# Patient Record
Sex: Male | Born: 1990 | Race: Black or African American | Hispanic: No | Marital: Single | Smoking: Current every day smoker
Health system: Southern US, Community
[De-identification: ages and names within clinical notes are randomized; demographics above are authoritative.]

---
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
## 2014-12-29 ENCOUNTER — Emergency Department (INDEPENDENT_AMBULATORY_CARE_PROVIDER_SITE_OTHER)
Admission: EM | Admit: 2014-12-29 | Discharge: 2014-12-29 | Disposition: A | Discharge status: Home / Self Care | Payer: BLUE CROSS/BLUE SHIELD | Source: Home / Self Care

## 2014-12-29 ENCOUNTER — Encounter (HOSPITAL_COMMUNITY): Payer: Self-pay | Admitting: Emergency Medicine

## 2014-12-29 DIAGNOSIS — H109 Unspecified conjunctivitis: Secondary | ICD-10-CM | POA: Diagnosis not present

## 2014-12-29 MED ORDER — POLYMYXIN B-TRIMETHOPRIM 10000-0.1 UNIT/ML-% OP SOLN: 2.0000 [drp] | OPHTHALMIC | Status: DC

## 2014-12-29 NOTE — Discharge Instructions (Signed)

## 2014-12-29 NOTE — ED Notes (Signed)
Left eye with symptoms that started today: eye drainage, watery, red

## 2014-12-30 NOTE — ED Provider Notes (Signed)
CSN: [id number]     Arrival date & time 12/29/14  1908 History   None    Chief Complaint  Patient presents with  . Conjunctivitis   (Consider location/radiation/quality/duration/timing/severity/associated sxs/prior Treatment) HPI History obtained from patient:   LOCATION:left eye SEVERITY:no pain just irritation DURATION:1 day CONTEXT:awoke with glue eye this morning, getting worse over the course of the day, sick kids at home QUALITY:itchy MODIFYING FACTORS:rinsed eye with warm water ASSOCIATED SYMPTOMS:runny nose TIMING:now constant  History reviewed. No pertinent past medical history. History reviewed. No pertinent past surgical history. No family history on file. Social History  Substance Use Topics  . Smoking status: Current Every Day Smoker  . Smokeless tobacco: None  . Alcohol Use: Yes    Review of Systems ROS +'ve "pinkeye" left  Denies: HEADACHE, NAUSEA, ABDOMINAL PAIN, CHEST PAIN, CONGESTION, DYSURIA, SHORTNESS OF BREATH  Allergies  Review of patient's allergies indicates no known allergies.  Home Medications   Prior to Admission medications   Medication Sig Start Date End Date Taking? Authorizing Provider  trimethoprim-polymyxin b (POLYTRIM) ophthalmic solution Place 2 drops into the left eye every 4 (four) hours. 12/29/14   Tharon AquasFrank C Aysha Livecchi, PA   Meds Ordered and Administered this Visit  Medications - No data to display  BP 132/82 mmHg  Pulse 57  Temp(Src) 99 F (37.2 C) (Oral)  Resp 16  SpO2 98% No data found.   Physical Exam  Constitutional: He appears well-developed and well-nourished.  HENT:  Head: Normocephalic and atraumatic.  Eyes: Pupils are equal, round, and reactive to light. Lids are everted and swept, no foreign bodies found. Left eye exhibits discharge. Left eye exhibits no hordeolum. No foreign body present in the left eye. Left conjunctiva is injected. Left conjunctiva has no hemorrhage. Left eye exhibits no nystagmus.  Nursing  note and vitals reviewed.   ED Course  Procedures (including critical care time)  Labs Review Labs Reviewed - No data to display  Imaging Review No results found.   Visual Acuity Review  Right Eye Distance:   Left Eye Distance:   Bilateral Distance:    Right Eye Near:   Left Eye Near:    Bilateral Near:         MDM   1. Conjunctivitis of left eye    NO visual disturbance, no sensation of FB. Rx for polytrim provided.  Advised to follow up if not steadily getting better. Return to work note provided.     Tharon AquasFrank C Millan Legan, PA 12/30/14 [phone]

## 2015-06-11 ENCOUNTER — Encounter (HOSPITAL_COMMUNITY): Payer: Self-pay

## 2015-06-11 ENCOUNTER — Ambulatory Visit (HOSPITAL_COMMUNITY)
Admission: EM | Admit: 2015-06-11 | Discharge: 2015-06-11 | Disposition: A | Discharge status: Home / Self Care | Payer: BLUE CROSS/BLUE SHIELD | Attending: Family Medicine | Admitting: Family Medicine

## 2015-06-11 DIAGNOSIS — F17210 Nicotine dependence, cigarettes, uncomplicated: Secondary | ICD-10-CM | POA: Insufficient documentation

## 2015-06-11 DIAGNOSIS — A64 Unspecified sexually transmitted disease: Secondary | ICD-10-CM

## 2015-06-11 DIAGNOSIS — Z114 Encounter for screening for human immunodeficiency virus [HIV]: Secondary | ICD-10-CM | POA: Insufficient documentation

## 2015-06-11 DIAGNOSIS — Z202 Contact with and (suspected) exposure to infections with a predominantly sexual mode of transmission: Secondary | ICD-10-CM | POA: Insufficient documentation

## 2015-06-11 LAB — POCT URINALYSIS DIP (DEVICE)
Bilirubin Urine: NEGATIVE
Glucose, UA: NEGATIVE mg/dL
KETONES UR: NEGATIVE mg/dL
Nitrite: NEGATIVE
PH: 6 (ref 5.0–8.0)
PROTEIN: NEGATIVE mg/dL
SPECIFIC GRAVITY, URINE: 1.02 (ref 1.005–1.030)
Urobilinogen, UA: 0.2 mg/dL (ref 0.0–1.0)

## 2015-06-11 MED ORDER — CEFTRIAXONE SODIUM 250 MG IJ SOLR
INTRAMUSCULAR | Status: AC
  Filled 2015-06-11: qty 250

## 2015-06-11 MED ORDER — LIDOCAINE HCL (PF) 1 % IJ SOLN
INTRAMUSCULAR | Status: AC
Start: 2015-06-11 — End: 2015-06-11
  Filled 2015-06-11: qty 5

## 2015-06-11 MED ORDER — CEFTRIAXONE SODIUM 250 MG IJ SOLR
250.0000 mg | Freq: Once | INTRAMUSCULAR | Status: AC
  Administered 2015-06-11: 250 mg via INTRAMUSCULAR

## 2015-06-11 MED ORDER — AZITHROMYCIN 250 MG PO TABS
1000.0000 mg | ORAL_TABLET | Freq: Once | ORAL | Status: AC
  Administered 2015-06-11: 1000 mg via ORAL

## 2015-06-11 MED ORDER — AZITHROMYCIN 250 MG PO TABS
ORAL_TABLET | ORAL | Status: AC
  Filled 2015-06-11: qty 4

## 2015-06-11 NOTE — Discharge Instructions (Signed)
Safe Sex  Safe sex is about reducing the risk of giving or getting a sexually transmitted disease (STD). STDs are spread through sexual contact involving the genitals, mouth, or rectum. Some STDs can be cured and others cannot. Safe sex can also prevent unintended pregnancies.   WHAT ARE SOME SAFE SEX PRACTICES?  · Limit your sexual activity to only one partner who is having sex with only you.  · Talk to your partner about his or her past partners, past STDs, and drug use.  · Use a condom every time you have sexual intercourse. This includes vaginal, oral, and anal sexual activity. Both females and males should wear condoms during oral sex. Only use latex or polyurethane condoms and water-based lubricants. Using petroleum-based lubricants or oils to lubricate a condom will weaken the condom and increase the chance that it will break. The condom should be in place from the beginning to the end of sexual activity. Wearing a condom reduces, but does not completely eliminate, your risk of getting or giving an STD. STDs can be spread by contact with infected body fluids and skin.  · Get vaccinated for hepatitis B and HPV.  · Avoid alcohol and recreational drugs, which can affect your judgment. You may forget to use a condom or participate in high-risk sex.  · For females, avoid douching after sexual intercourse. Douching can spread an infection farther into the reproductive tract.  · Check your body for signs of sores, blisters, rashes, or unusual discharge. See your health care provider if you notice any of these signs.  · Avoid sexual contact if you have symptoms of an infection or are being treated for an STD. If you or your partner has herpes, avoid sexual contact when blisters are present. Use condoms at all other times.  · If you are at risk of being infected with HIV, it is recommended that you take a prescription medicine daily to prevent HIV infection. This is called pre-exposure prophylaxis (PrEP). You are  considered at risk if:    You are a man who has sex with other men (MSM).    You are a heterosexual man or woman who is sexually active with more than one partner.    You take drugs by injection.    You are sexually active with a partner who has HIV.  · Talk with your health care provider about whether you are at high risk of being infected with HIV. If you choose to begin PrEP, you should first be tested for HIV. You should then be tested every 3 months for as long as you are taking PrEP.  · See your health care provider for regular screenings, exams, and tests for other STDs. Before having sex with a new partner, each of you should be screened for STDs and should talk about the results with each other.  WHAT ARE THE BENEFITS OF SAFE SEX?   · There is less chance of getting or giving an STD.  · You can prevent unwanted or unintended pregnancies.  · By discussing safe sex concerns with your partner, you may increase feelings of intimacy, comfort, trust, and honesty between the two of you.     This information is not intended to replace advice given to you by your health care provider. Make sure you discuss any questions you have with your health care provider.     Document Released: 03/17/2004 Document Revised: 02/28/2014 Document Reviewed: 08/01/2011  Elsevier Interactive Patient Education ©2016 [employer]

## 2015-06-11 NOTE — ED Provider Notes (Signed)
CSN: [id number]     Arrival date & time 06/11/15  1317 History   First MD Initiated Contact with Patient 06/11/15 1419     Chief Complaint  Patient presents with  . Exposure to STD   (Consider location/radiation/quality/duration/timing/severity/associated sxs/prior Treatment) HPI History obtained from patient: Location:penis Context/Duration1 week onset of white burning discharge from penis, unprotected intercourse with Male partner Severity:1  Quality: buring Timing:      constant      Home Treatment: none Associated symptoms:  none  History reviewed. No pertinent past medical history. History reviewed. No pertinent past surgical history. No family history on file. Social History  Substance Use Topics  . Smoking status: Current Every Day Smoker -- 1.00 packs/day    Types: Cigarettes  . Smokeless tobacco: Never Used  . Alcohol Use: Yes     Comment: occasional    Review of Systems Penile discharge Allergies  Review of patient's allergies indicates no known allergies.  Home Medications   Prior to Admission medications   Medication Sig Start Date End Date Taking? Authorizing Provider  trimethoprim-polymyxin b (POLYTRIM) ophthalmic solution Place 2 drops into the left eye every 4 (four) hours. 12/29/14   Tharon Aquas, PA   Meds Ordered and Administered this Visit   Medications  cefTRIAXone (ROCEPHIN) injection 250 mg (250 mg Intramuscular Given 06/11/15 1519)  azithromycin (ZITHROMAX) tablet 1,000 mg (1,000 mg Oral Given 06/11/15 1508)    BP 125/77 mmHg  Pulse 60  Temp(Src) 99 F (37.2 C) (Oral)  Resp 16  SpO2 100% No data found.   Physical Exam NURSES NOTES AND VITAL SIGNS REVIEWED. CONSTITUTIONAL: Well developed, well nourished, no acute distress HEENT: normocephalic, atraumatic EYES: Conjunctiva normal NECK:normal ROM, supple, no adenopathy PULMONARY:No respiratory distress, normal effort MUSCULOSKELETAL: Normal ROM of all extremities,  SKIN: warm and  dry without rash PSYCHIATRIC: Mood and affect, behavior are normal GENITALIA: normal external male with white discharge from penis.   ED Course  Procedures (including critical care time)  Labs Review Labs Reviewed  POCT URINALYSIS DIP (DEVICE) - Abnormal; Notable for the following:    Hgb urine dipstick TRACE (*)    Leukocytes, UA LARGE (*)    All other components within normal limits  HIV ANTIBODY (ROUTINE TESTING)  CYTOLOGY, (ORAL, ANAL, URETHRAL) ANCILLARY ONLY    Imaging Review No results found.   Visual Acuity Review  Right Eye Distance:   Left Eye Distance:   Bilateral Distance:    Right Eye Near:   Left Eye Near:    Bilateral Near:      tx with azithromycin and ceftriaxone.  Pt is advised that partner should be advised and treated. There is no diagnosis at this time.  Cultures are pending along with HIV.   MDM   1. Sexually transmitted disease (STD)     Patient is reassured that there are no issues that require transfer to higher level of care at this time or additional tests. Patient is advised to continue home symptomatic treatment. Patient is advised that if there are new or worsening symptoms to attend the emergency department, contact primary care provider, or return to UC. Instructions of care provided discharged home in stable condition.    THIS NOTE WAS GENERATED USING A VOICE RECOGNITION SOFTWARE PROGRAM. ALL REASONABLE EFFORTS  WERE MADE TO PROOFREAD THIS DOCUMENT FOR ACCURACY.  I have verbally reviewed the discharge instructions with the patient. A printed AVS was given to the patient.  All questions were answered prior to  discharge.      Tharon AquasFrank C Asma Boldon, PA 06/11/15 [phone]

## 2015-06-11 NOTE — ED Notes (Signed)
Patient has been exposed to STD, but not sure if he has a UTI. His symptoms have been discharge from penis x1 week, no pain No acute distress

## 2015-06-12 LAB — HIV ANTIBODY (ROUTINE TESTING W REFLEX): HIV SCREEN 4TH GENERATION: NONREACTIVE

## 2015-06-12 LAB — CYTOLOGY, (ORAL, ANAL, URETHRAL) ANCILLARY ONLY
Chlamydia: NEGATIVE
NEISSERIA GONORRHEA: POSITIVE — AB

## 2015-06-16 ENCOUNTER — Telehealth (HOSPITAL_COMMUNITY): Payer: Self-pay | Admitting: Emergency Medicine

## 2015-06-16 NOTE — ED Notes (Signed)
LM on pt's VM [phone] Need to give lab results from recent visit on 4/20 Also let pt know labs can be obtained from MyChart  Per Dr. Dayton ScrapeMurray,  Clinical staff, please let patient and health department know that test for gonorrhea was positive.  This was treated at urgent care visit 06/11/15 with IM rocephin 250mg  and po zithromax 1g.  Tests for chlamydia and HIV were negative.  Recheck for further evaluation if symptoms persist.  Result note copied into patient's MyChart. Ria ClockLaura Murray MD   [facility]

## 2015-06-25 NOTE — ED Notes (Signed)
LM on pt's VM [phone] Need to give lab results from recent visit on 4/20 Also let pt know labs can be obtained from MyChart  Per Dr. Dayton ScrapeMurray,  Clinical staff, please let patient and health department know that test for gonorrhea was positive.  This was treated at urgent care visit 06/11/15 with IM rocephin 250mg  and po zithromax 1g.  Tests for chlamydia and HIV were negative.  Recheck for further evaluation if symptoms persist.  Result note copied into patient's MyChart. Ria ClockLaura Murray MD

## 2015-09-30 ENCOUNTER — Ambulatory Visit (HOSPITAL_COMMUNITY)
Admission: EM | Admit: 2015-09-30 | Discharge: 2015-09-30 | Disposition: A | Discharge status: Home / Self Care | Payer: Self-pay | Attending: Family Medicine | Admitting: Family Medicine

## 2015-09-30 ENCOUNTER — Encounter (HOSPITAL_COMMUNITY): Payer: Self-pay | Admitting: *Deleted

## 2015-09-30 DIAGNOSIS — F17210 Nicotine dependence, cigarettes, uncomplicated: Secondary | ICD-10-CM | POA: Insufficient documentation

## 2015-09-30 DIAGNOSIS — R369 Urethral discharge, unspecified: Secondary | ICD-10-CM | POA: Insufficient documentation

## 2015-09-30 DIAGNOSIS — Z7251 High risk heterosexual behavior: Secondary | ICD-10-CM

## 2015-09-30 DIAGNOSIS — Z202 Contact with and (suspected) exposure to infections with a predominantly sexual mode of transmission: Secondary | ICD-10-CM

## 2015-09-30 MED ORDER — AZITHROMYCIN 250 MG PO TABS
1000.0000 mg | ORAL_TABLET | Freq: Once | ORAL | Status: AC
  Administered 2015-09-30: 1000 mg via ORAL

## 2015-09-30 MED ORDER — CEFTRIAXONE SODIUM 250 MG IJ SOLR
INTRAMUSCULAR | Status: AC
  Filled 2015-09-30: qty 250

## 2015-09-30 MED ORDER — CEFTRIAXONE SODIUM 250 MG IJ SOLR
250.0000 mg | Freq: Once | INTRAMUSCULAR | Status: AC
  Administered 2015-09-30: 250 mg via INTRAMUSCULAR

## 2015-09-30 NOTE — ED Triage Notes (Signed)
Pt reports  Symptoms  Of  Penile  Discharge   X  3  Days    denys  Any sores   Or any  Any other symptoms   Pt is  Sitting upright on the  Exam table  Appearing  In no  Acute distress

## 2015-09-30 NOTE — Discharge Instructions (Signed)
As we discussed, if you have any other sexual partners, they need to be tested and treated as well.  I HIGHLY recommend that you practice safe sex.  USE A CONDOM EVERY TIME YOU HAVE INTERCOURSE.  Gonorrhea is treatable; however, some infections like HIV/ Hepatitis/ Herpes are incurable.  Do not have intercourse for the next 10 days.  You will be contacted if any of your other labs come back positive.

## 2015-09-30 NOTE — ED Provider Notes (Signed)
CSN: [id number]     Arrival date & time 09/30/15  1439 History   First MD Initiated Contact with Patient 09/30/15 1454     Chief Complaint  Patient presents with  . Penile Discharge   (Consider location/radiation/quality/duration/timing/severity/associated sxs/prior Treatment) HPI Patient reports that his sexual partner was diagnosed with gonorrhea about 4 days ago.  Last unprotected sex was about 1 week ago.  Sexually active with one male partner.  Has a PMH of GC about 5-6 months ago with a different partner.  Reports dysuria and penile discharge for about 2-3 days.  No fevers, chills, nausea, vomiting, inguinal LAD, no GU rashes or lesions.  Does not take any medications.  SocHx: everyday smoker marijuana, tobacco.  Occ ETOH use.  NKDA.    History reviewed. No pertinent past medical history. History reviewed. No pertinent surgical history. History reviewed. No pertinent family history. Social History  Substance Use Topics  . Smoking status: Current Every Day Smoker    Packs/day: 1.00    Types: Cigarettes  . Smokeless tobacco: Never Used  . Alcohol use Yes     Comment: occasional    Review of Systems  Constitutional: Negative for chills, fatigue and fever.  Eyes: Negative for discharge.  Gastrointestinal: Negative for abdominal pain, nausea and vomiting.  Genitourinary: Positive for discharge and dysuria. Negative for decreased urine volume, difficulty urinating, genital sores, penile pain, penile swelling, scrotal swelling, testicular pain and urgency.  Musculoskeletal: Negative for arthralgias and joint swelling.  Skin: Negative for rash.    Allergies  Review of patient's allergies indicates no known allergies.  Home Medications   Prior to Admission medications   Medication Sig Start Date End Date Taking? Authorizing Provider  trimethoprim-polymyxin b (POLYTRIM) ophthalmic solution Place 2 drops into the left eye every 4 (four) hours. 12/29/14   Tharon AquasFrank C Patrick, PA    Meds Ordered and Administered this Visit   Medications  cefTRIAXone (ROCEPHIN) injection 250 mg (not administered)  azithromycin (ZITHROMAX) tablet 1,000 mg (not administered)    BP 142/77 (BP Location: Right Arm)   Pulse 78   Temp 98.6 F (37 C) (Oral)   Resp 16   SpO2 98%  No data found.   Physical Exam  Constitutional: He appears well-developed and well-nourished. No distress.  HENT:  Mouth/Throat: Oropharynx is clear and moist. No oropharyngeal exudate.  Eyes: Conjunctivae and EOM are normal.  Cardiovascular: Normal rate and intact distal pulses.   Pulmonary/Chest: Effort normal.  Abdominal: Soft. There is no tenderness.  Genitourinary: Penis normal. No penile tenderness.  Genitourinary Comments: No discharge appreciated from penis.  Small well healed scrotal lesion on right side.  Nontender to palpation.  NO suprapubic TTP  Lymphadenopathy:       Right: No inguinal adenopathy present.       Left: No inguinal adenopathy present.  Skin: He is not diaphoretic.  Nursing note and vitals reviewed.  GU exam performed in the presence of Alinda Moneyony, Charity fundraiserN.  Urgent Care Course   Clinical Course   1501: Urine GC/CT and trichomonas ordered, HIV and RPR testing also ordered  Procedures (including critical care time)  Labs Review Labs Reviewed  HIV ANTIBODY (ROUTINE TESTING)  RPR  URINE CYTOLOGY ANCILLARY ONLY   Imaging Review No results found.   MDM   1. Exposure to gonorrhea   2. High risk sexual behavior    Richard Doreene BurkeM Jacobsen is a 25 y.o. male that presents today for penile discharge and known exposure to gonorrhea.  No  discharge appreciated on exam.  No inguinal LAD appreciated.  Patient is nontoxic on exam.  I suspect dysuria is a result of urethritis secondary to GC infection.  No systemic symptoms at this time.  No suprapubic TTP.  We reviewed safe sex practices and I encouraged condom use with each sexual intercourse.  Recommended that any other sexual partners be  tested and treated.  Urine GC/CT and trich obtained.  Patient also to be tested for HIV and syphilis.  Because patient's partner GC+, will also cover for chlamydial infection.  Azithromycin and Rocephin administered in office.  Return precautions reviewed.    Meds ordered this encounter  Medications  . cefTRIAXone (ROCEPHIN) injection 250 mg  . azithromycin (ZITHROMAX) tablet 1,000 mg   This patient was discussed with Hayden Rasmussen, NP, who agrees with my assessment and plan.   Ashly M. Nadine Counts, DO PGY-3, [facility]       Raliegh Ip, DO 09/30/15 [phone]

## 2015-10-01 LAB — RPR: RPR: NONREACTIVE

## 2015-10-01 LAB — URINE CYTOLOGY ANCILLARY ONLY
Chlamydia: NEGATIVE
Neisseria Gonorrhea: POSITIVE — AB
Trichomonas: NEGATIVE

## 2015-10-01 LAB — HIV ANTIBODY (ROUTINE TESTING W REFLEX): HIV Screen 4th Generation wRfx: NONREACTIVE

## 2015-10-07 ENCOUNTER — Telehealth (HOSPITAL_COMMUNITY): Payer: Self-pay | Admitting: Emergency Medicine

## 2015-10-07 NOTE — Telephone Encounter (Signed)
Per Dr. Dayton ScrapeMurray,  Notes Recorded by Eustace MooreLaura W Murray, MD on 10/03/2015 at 11:02 AM EDT Please let patient and health department know that test for gonorrhea was positive.  This was treated at the urgent care with IM rocephin 250mg  and po zithromax 1g.  Sexual partners need to be notified and tested/treated.   Recheck as needed if symptoms persist.  Note sent to patient's MyChart. Ria ClockLaura Murray MD  LM on pt's VM Need to give lab results from recent visit on 8/9 Also let pt know labs can be obtained from MyChart [facility]

## 2015-10-26 NOTE — Telephone Encounter (Signed)
-----   Message from Eustace MooreLaura W Murray, MD sent at 10/03/2015 11:02 AM EDT ----- Please let patient and health department know that test for gonorrhea was positive.   This was treated at the urgent care with IM rocephin 250mg  and po zithromax 1g.  Sexual partners need to be notified and tested/treated.    Recheck as needed if symptoms persist.   Note sent to patient's MyChart.  Ria ClockLaura Murray MD

## 2015-10-26 NOTE — Telephone Encounter (Signed)
LM on pt's VM [phone]... Mailed letter Need to give lab results from recent visit on 8/9 Also let pt know labs can be obtained from MyChart

## 2017-04-02 ENCOUNTER — Other Ambulatory Visit: Payer: Self-pay

## 2017-04-02 ENCOUNTER — Emergency Department (HOSPITAL_COMMUNITY): Payer: Self-pay

## 2017-04-02 ENCOUNTER — Emergency Department (HOSPITAL_COMMUNITY)
Admission: EM | Admit: 2017-04-02 | Discharge: 2017-04-03 | Disposition: A | Discharge status: Home / Self Care | Payer: Self-pay | Attending: Physician Assistant | Admitting: Physician Assistant

## 2017-04-02 ENCOUNTER — Encounter (HOSPITAL_COMMUNITY): Payer: Self-pay | Admitting: Emergency Medicine

## 2017-04-02 DIAGNOSIS — J111 Influenza due to unidentified influenza virus with other respiratory manifestations: Secondary | ICD-10-CM | POA: Insufficient documentation

## 2017-04-02 DIAGNOSIS — F17210 Nicotine dependence, cigarettes, uncomplicated: Secondary | ICD-10-CM | POA: Insufficient documentation

## 2017-04-02 DIAGNOSIS — R112 Nausea with vomiting, unspecified: Secondary | ICD-10-CM | POA: Insufficient documentation

## 2017-04-02 DIAGNOSIS — R42 Dizziness and giddiness: Secondary | ICD-10-CM | POA: Insufficient documentation

## 2017-04-02 DIAGNOSIS — R51 Headache: Secondary | ICD-10-CM | POA: Insufficient documentation

## 2017-04-02 DIAGNOSIS — R6889 Other general symptoms and signs: Secondary | ICD-10-CM

## 2017-04-02 DIAGNOSIS — R197 Diarrhea, unspecified: Secondary | ICD-10-CM | POA: Insufficient documentation

## 2017-04-02 LAB — BASIC METABOLIC PANEL
ANION GAP: 12 (ref 5–15)
BUN: 8 mg/dL (ref 6–20)
CALCIUM: 8.8 mg/dL — AB (ref 8.9–10.3)
CO2: 23 mmol/L (ref 22–32)
CREATININE: 0.88 mg/dL (ref 0.61–1.24)
Chloride: 104 mmol/L (ref 101–111)
Glucose, Bld: 124 mg/dL — ABNORMAL HIGH (ref 65–99)
Potassium: 3.8 mmol/L (ref 3.5–5.1)
Sodium: 139 mmol/L (ref 135–145)

## 2017-04-02 LAB — CBC WITH DIFFERENTIAL/PLATELET
BASOS ABS: 0 10*3/uL (ref 0.0–0.1)
Basophils Relative: 0 %
EOS ABS: 0 10*3/uL (ref 0.0–0.7)
EOS PCT: 0 %
HCT: 48.9 % (ref 39.0–52.0)
Hemoglobin: 16.6 g/dL (ref 13.0–17.0)
Lymphocytes Relative: 8 %
Lymphs Abs: 0.7 10*3/uL (ref 0.7–4.0)
MCH: 29.6 pg (ref 26.0–34.0)
MCHC: 33.9 g/dL (ref 30.0–36.0)
MCV: 87.3 fL (ref 78.0–100.0)
MONO ABS: 0.5 10*3/uL (ref 0.1–1.0)
Monocytes Relative: 6 %
NEUTROS ABS: 7.7 10*3/uL (ref 1.7–7.7)
Neutrophils Relative %: 86 %
PLATELETS: 195 10*3/uL (ref 150–400)
RBC: 5.6 MIL/uL (ref 4.22–5.81)
RDW: 12.9 % (ref 11.5–15.5)
WBC: 8.9 10*3/uL (ref 4.0–10.5)

## 2017-04-02 MED ORDER — SODIUM CHLORIDE 0.9 % IV BOLUS (SEPSIS)
1000.0000 mL | Freq: Once | INTRAVENOUS | Status: AC
  Administered 2017-04-02: 1000 mL via INTRAVENOUS

## 2017-04-02 MED ORDER — ACETAMINOPHEN 325 MG PO TABS
650.0000 mg | ORAL_TABLET | Freq: Once | ORAL | Status: AC
  Administered 2017-04-02: 650 mg via ORAL
  Filled 2017-04-02: qty 2

## 2017-04-02 MED ORDER — ONDANSETRON 4 MG PO TBDP
4.0000 mg | ORAL_TABLET | Freq: Once | ORAL | Status: AC
  Administered 2017-04-02: 4 mg via ORAL
  Filled 2017-04-02: qty 1

## 2017-04-02 NOTE — ED Notes (Signed)
Taken to xray at this time. 

## 2017-04-02 NOTE — ED Provider Notes (Signed)
[facility] EMERGENCY DEPARTMENT Provider Note   CSN: [id number] Arrival date & time: 04/02/17  2302     History   Chief Complaint Chief Complaint  Patient presents with  . Flu like S/S    HPI Richard Gallegos is a 27 y.o. male.  27 y/o male presenting to the ED c/o productive cough with yellow sputum that began last night. He also reports fevers, body aches, sore throat, and headache. Headache is frontal headache started gradually. Not sudden onset and not worst headache of life. Rates it 3/10. Also reports nausea, vomiting, diarrhea started this afternoon. States he has had 3 episodes of vomiting and 3 episodes of diarrhea. Reports watery diarrhea. Denies blood in vomit or stool. Has intermittent abdominal cramping prior to episodes of diarrhea, but no constant and/or localized abdominal pain. Cramping relieved with diarrhea. Since then has had some lightheadedness upon standing.   He denies difficulty swallowing, neck pain, neck stiffness, rashes, vision changes, weakness/numbness, dizziness, shortness of breath, chest pain, or syncope.   Has taken mucinex with no relief.  History reviewed. No pertinent past medical history.  There are no active problems to display for this patient.   History reviewed. No pertinent surgical history.    Home Medications    Prior to Admission medications   Medication Sig Start Date End Date Taking? Authorizing Provider  guaiFENesin (MUCINEX) 600 MG 12 hr tablet Take 600 mg by mouth 2 (two) times daily as needed for cough.   Yes [provider]  benzonatate (TESSALON) 100 MG capsule Take 1 capsule (100 mg total) by mouth every 8 (eight) hours. 04/03/17   Grayland Daisey S, PA-C  ondansetron (ZOFRAN) 4 MG tablet Take 1 tablet (4 mg total) by mouth every 8 (eight) hours as needed for nausea or vomiting. 04/03/17   Mecca Barga S, PA-C  oseltamivir (TAMIFLU) 75 MG capsule Take 1 capsule (75 mg total) by mouth every  12 (twelve) hours. 04/03/17   Lowell Makara S, PA-C  trimethoprim-polymyxin b (POLYTRIM) ophthalmic solution Place 2 drops into the left eye every 4 (four) hours. Patient not taking: Reported on 04/03/2017 12/29/14   Tharon Aquas, PA    Family History No family history on file.  Social History Social History   Tobacco Use  . Smoking status: Current Every Day Smoker    Packs/day: 0.50    Types: Cigarettes  . Smokeless tobacco: Never Used  Substance Use Topics  . Alcohol use: Yes    Comment: occasional  . Drug use: Yes    Frequency: 4.0 times per week    Types: Marijuana     Allergies   Patient has no known allergies.   Review of Systems Review of Systems  Constitutional: Positive for chills, fatigue and fever.  HENT: Positive for congestion, rhinorrhea and sore throat. Negative for ear pain, postnasal drip, sinus pressure, sinus pain, trouble swallowing and voice change.   Respiratory: Positive for cough. Negative for shortness of breath and wheezing.   Cardiovascular: Negative for chest pain and palpitations.  Gastrointestinal: Positive for abdominal pain (cramping intermittent), diarrhea, nausea and vomiting. Negative for blood in stool and constipation.  Genitourinary: Negative for dysuria, frequency and hematuria.  Musculoskeletal: Negative for back pain, neck pain and neck stiffness.  Skin: Negative for color change and rash.  Neurological: Positive for light-headedness and headaches. Negative for dizziness, weakness and numbness.     Physical Exam Updated Vital Signs BP 125/74   Pulse 72   Temp  99 F (37.2 C) (Oral)   Resp 16   Ht 6\' 1"  (1.854 m)   Wt 117.9 kg (260 lb)   SpO2 98%   BMI 34.30 kg/m   Physical Exam  Constitutional: He is oriented to person, place, and time. He appears well-developed and well-nourished.  HENT:  Head: Normocephalic and atraumatic.  Right Ear: External ear normal.  Left Ear: External ear normal.  Nose: Nose normal.    Mouth/Throat: Oropharynx is clear and moist.  Mild pharyngeal erythema.  No tonsils present.  No exudates noted.  No hoarse voice.  Uvula midline.  Eyes: Conjunctivae and EOM are normal. Pupils are equal, round, and reactive to light.  Neck: Normal range of motion. Neck supple.  No nuchal rigidity, full range of motion of neck without pain  Cardiovascular: Normal rate, regular rhythm, normal heart sounds and intact distal pulses.  No murmur heard. Pulmonary/Chest: Effort normal and breath sounds normal. No stridor. No respiratory distress. He has no wheezes.  Abdominal: Soft. Bowel sounds are normal. He exhibits no distension. There is no tenderness. There is no guarding.  Musculoskeletal: Normal range of motion. He exhibits no edema.  Neurological: He is alert and oriented to person, place, and time. No cranial nerve deficit.  Mental Status:  Alert, thought content appropriate, able to give a coherent history. Speech fluent without evidence of aphasia. Able to follow 2 step commands without difficulty.  Cranial Nerves:  II:  Peripheral visual fields grossly normal, pupils equal, round, reactive to light III,IV, VI: ptosis not present, extra-ocular motions intact bilaterally  V,VII: smile symmetric, facial light touch sensation equal VIII: hearing grossly normal to voice  X: uvula elevates symmetrically  XI: bilateral shoulder shrug symmetric and strong XII: midline tongue extension without fassiculations Motor:  Normal tone. 5/5 strength of BUE and BLE major muscle groups including strong and equal grip strength and dorsiflexion/plantar flexion Sensory: light touch normal in all extremities.  Skin: Skin is warm and dry.  Psychiatric: He has a normal mood and affect.  Nursing note and vitals reviewed.    ED Treatments / Results  Labs (all labs ordered are listed, but only abnormal results are displayed) Labs Reviewed  BASIC METABOLIC PANEL - Abnormal; Notable for the following  components:      Result Value   Glucose, Bld 124 (*)    Calcium 8.8 (*)    All other components within normal limits  CBC WITH DIFFERENTIAL/PLATELET    EKG  EKG Interpretation None       Radiology Dg Chest 2 View  Result Date: 04/02/2017 CLINICAL DATA:  27 y/o M; chills, congestion, body aches, fever, sore throat. EXAM: CHEST  2 VIEW COMPARISON:  None. FINDINGS: The heart size and mediastinal contours are within normal limits. Both lungs are clear. The visualized skeletal structures are unremarkable. IMPRESSION: No active cardiopulmonary disease. Electronically Signed   By: Mitzi HansenLance  Furusawa-Stratton M.D.   On: 04/02/2017 23:41    Procedures Procedures (including critical care time)  Medications Ordered in ED Medications  acetaminophen (TYLENOL) tablet 650 mg (650 mg Oral Given 04/02/17 2349)  sodium chloride 0.9 % bolus 1,000 mL (0 mLs Intravenous Stopped 04/03/17 0101)  ondansetron (ZOFRAN-ODT) disintegrating tablet 4 mg (4 mg Oral Given 04/02/17 2349)     Initial Impression / Assessment and Plan / ED Course  I have reviewed the triage vital signs and the nursing notes.  Pertinent labs & imaging results that were available during my care of the patient were reviewed  by me and considered in my medical decision making (see chart for details).   Rechecked pt. He is sitting in bed in no acute distress. States he feels improved after fluids and medications. Still has a slight headache. Continues to deny vision changes or neck stiffness. He has remained afebrile here. No SOB or chest pain. No continued lightheadedness. Discussed likely dx of flu and plan for dc with tamiflu. Advised OTC antipyretics for fever and pt states he does not need rx for these and that he has them at home. Will give cough medication and zofran. Discussed plan for f/u and return precautions. Pt is aware of the plan and has no further questions.   Final Clinical Impressions(s) / ED Diagnoses   Final diagnoses:    Flu-like symptoms  Non-intractable vomiting with nausea, unspecified vomiting type  Diarrhea, unspecified type    27 year old male presenting with flulike symptoms for 1 day. Also with nausea, vomiting, and diarrhea.    Initial exam Mildly tachycardic at 104 and hypertensive to 144/115.  Temp 99 Fahrenheit. Normal O2 sat and normal respiration. Afebrile. Patient nontoxic-appearing and in no acute distress.  Mild tachycardia suspected to be due to dehydration given GI illness. No chest pain, sob, vision changes, weakness/numbness. Will give fluid bolus and recheck vital signs.  CBC and BMP are reassuring with no elevated white count and no electrolyte abnormalities.  Chest x-ray without evidence of pneumonia.  Fluid bolus given and pt feels back to baseline. BP 125/74 and HR 72. O2 sat 98% on ra. Patient with symptoms consistent with influenza. Fluid bolus given. tolerating PO's.  Lungs are clear.  Discussed the cost versus benefit of Tamiflu treatment with the patient. Pt requesting tamiflu. Patient will be discharged with instructions to orally hydrate, rest, and use over-the-counter medications such as anti-inflammatories ibuprofen and Aleve for muscle aches and Tylenol for fever.  Patient will also be given a cough suppressant. Also gave zofran for continued nausea. Pt understands plan for f/u and reasons to return sooner.    ED Discharge Orders        Ordered    ondansetron (ZOFRAN) 4 MG tablet  Every 8 hours PRN     04/03/17 0110    oseltamivir (TAMIFLU) 75 MG capsule  Every 12 hours     04/03/17 0110    benzonatate (TESSALON) 100 MG capsule  Every 8 hours     04/03/17 0110       Delsa Walder S, PA-C 04/03/17 0127    Abelino Derrick, MD 04/06/17 2326

## 2017-04-02 NOTE — ED Triage Notes (Signed)
Pt reports flu like S/S starting this AM. Chills, congestion, body aches, fever, sore throat. Pt has taken Mucinex at home

## 2017-04-03 MED ORDER — OSELTAMIVIR PHOSPHATE 75 MG PO CAPS: 75.0000 mg | ORAL_CAPSULE | Freq: Two times a day (BID) | ORAL | 0 refills | Status: DC

## 2017-04-03 MED ORDER — ONDANSETRON HCL 4 MG PO TABS: 4.0000 mg | ORAL_TABLET | Freq: Three times a day (TID) | ORAL | 0 refills | Status: DC | PRN

## 2017-04-03 MED ORDER — BENZONATATE 100 MG PO CAPS: 100.0000 mg | ORAL_CAPSULE | Freq: Three times a day (TID) | ORAL | 0 refills | Status: DC

## 2017-04-03 NOTE — Discharge Instructions (Signed)
You are given a prescription for Tamiflu to be taken for suspected influenza.  You are also given a nausea medication called Zofran.  You are also given medicine for your cough called benzonatate.  Take these medications as directed on your discharge instructions.  You should follow up with your primary healthcare provider within the next 3-5 days for reevaluation.  If you do not have a primary healthcare provider I provided information for the to [facility] so that you may make an appointment to follow-up with them  You will need to return to the emergency department immediately if you experience any of the following symptoms:  Difficulty breathing or shortness or breath Pain or pressure in the chest or abdomen Sudden dizziness Confusion Severe or persistent vomiting Flu-like symptoms that improve but then return with fever or worse cough  You should stay home for at least 24 hours after your fever is gone except to get medical care or other necessities. Your fever should be gone without the need to use a fever-reducing medicine, such as Tylenol or Motrin. Until then, you should stay home from work, school, travel, shopping, social events, and public gatherings.  Stay away from others as much as possible to keep from infecting them. If you must leave home, for example to get medical care, wear a facemask if you have one, or cover coughs and sneezes with a tissue. Wash your hands often to keep from spreading flu to others

## 2017-12-25 ENCOUNTER — Inpatient Hospital Stay (HOSPITAL_COMMUNITY)
Admission: EM | Admit: 2017-12-25 | Discharge: 2017-12-27 | DRG: 639 | Disposition: A | Discharge status: Home / Self Care | Payer: Self-pay | Attending: Family Medicine | Admitting: Family Medicine

## 2017-12-25 ENCOUNTER — Encounter (HOSPITAL_COMMUNITY): Payer: Self-pay | Admitting: *Deleted

## 2017-12-25 DIAGNOSIS — Z888 Allergy status to other drugs, medicaments and biological substances status: Secondary | ICD-10-CM

## 2017-12-25 DIAGNOSIS — E872 Acidosis, unspecified: Secondary | ICD-10-CM | POA: Diagnosis present

## 2017-12-25 DIAGNOSIS — E669 Obesity, unspecified: Secondary | ICD-10-CM | POA: Diagnosis present

## 2017-12-25 DIAGNOSIS — Z720 Tobacco use: Secondary | ICD-10-CM | POA: Insufficient documentation

## 2017-12-25 DIAGNOSIS — Z833 Family history of diabetes mellitus: Secondary | ICD-10-CM

## 2017-12-25 DIAGNOSIS — R9431 Abnormal electrocardiogram [ECG] [EKG]: Secondary | ICD-10-CM | POA: Diagnosis present

## 2017-12-25 DIAGNOSIS — Z23 Encounter for immunization: Secondary | ICD-10-CM

## 2017-12-25 DIAGNOSIS — E1310 Other specified diabetes mellitus with ketoacidosis without coma: Secondary | ICD-10-CM

## 2017-12-25 DIAGNOSIS — E860 Dehydration: Secondary | ICD-10-CM | POA: Diagnosis present

## 2017-12-25 DIAGNOSIS — Z6838 Body mass index (BMI) 38.0-38.9, adult: Secondary | ICD-10-CM

## 2017-12-25 DIAGNOSIS — E1110 Type 2 diabetes mellitus with ketoacidosis without coma: Principal | ICD-10-CM | POA: Diagnosis present

## 2017-12-25 DIAGNOSIS — R000 Tachycardia, unspecified: Secondary | ICD-10-CM | POA: Diagnosis present

## 2017-12-25 DIAGNOSIS — R030 Elevated blood-pressure reading, without diagnosis of hypertension: Secondary | ICD-10-CM | POA: Diagnosis present

## 2017-12-25 DIAGNOSIS — F17210 Nicotine dependence, cigarettes, uncomplicated: Secondary | ICD-10-CM | POA: Diagnosis present

## 2017-12-25 LAB — URINALYSIS, ROUTINE W REFLEX MICROSCOPIC
Bilirubin Urine: NEGATIVE
KETONES UR: 80 mg/dL — AB
LEUKOCYTES UA: NEGATIVE
Nitrite: NEGATIVE
Protein, ur: 100 mg/dL — AB
SPECIFIC GRAVITY, URINE: 1.031 — AB (ref 1.005–1.030)
pH: 5 (ref 5.0–8.0)

## 2017-12-25 LAB — BASIC METABOLIC PANEL
ANION GAP: 16 — AB (ref 5–15)
BUN: 6 mg/dL (ref 6–20)
CALCIUM: 8.3 mg/dL — AB (ref 8.9–10.3)
CHLORIDE: 106 mmol/L (ref 98–111)
CO2: 11 mmol/L — AB (ref 22–32)
Creatinine, Ser: 1.13 mg/dL (ref 0.61–1.24)
GFR calc non Af Amer: >60 mL/min (ref >60)
GLUCOSE: 216 mg/dL — AB (ref 70–99)
Potassium: 3.6 mmol/L (ref 3.5–5.1)
Sodium: 133 mmol/L — ABNORMAL LOW (ref 135–145)

## 2017-12-25 LAB — I-STAT TROPONIN, ED: TROPONIN I, POC: 0 ng/mL (ref 0.00–0.08)

## 2017-12-25 LAB — CBC
HEMATOCRIT: 51.8 % (ref 39.0–52.0)
Hemoglobin: 16.8 g/dL (ref 13.0–17.0)
MCH: 26.5 pg (ref 26.0–34.0)
MCHC: 32.4 g/dL (ref 30.0–36.0)
MCV: 81.7 fL (ref 80.0–100.0)
Platelets: 261 10*3/uL (ref 150–400)
RBC: 6.34 MIL/uL — ABNORMAL HIGH (ref 4.22–5.81)
RDW: 12.4 % (ref 11.5–15.5)
WBC: 10.5 10*3/uL (ref 4.0–10.5)
nRBC: 0 % (ref 0.0–0.2)

## 2017-12-25 LAB — GLUCOSE, CAPILLARY
GLUCOSE-CAPILLARY: 158 mg/dL — AB (ref 70–99)
GLUCOSE-CAPILLARY: 217 mg/dL — AB (ref 70–99)
GLUCOSE-CAPILLARY: 176 mg/dL — AB (ref 70–99)
Glucose-Capillary: 182 mg/dL — ABNORMAL HIGH (ref 70–99)

## 2017-12-25 LAB — I-STAT VENOUS BLOOD GAS, ED
ACID-BASE DEFICIT: 14 mmol/L — AB (ref 0.0–2.0)
Bicarbonate: 11.8 mmol/L — ABNORMAL LOW (ref 20.0–28.0)
O2 SAT: 72 %
TCO2: 13 mmol/L — ABNORMAL LOW (ref 22–32)
pCO2, Ven: 27.4 mmHg — ABNORMAL LOW (ref 44.0–60.0)
pH, Ven: 7.241 — ABNORMAL LOW (ref 7.250–7.430)
pO2, Ven: 43 mmHg (ref 32.0–45.0)

## 2017-12-25 LAB — COMPREHENSIVE METABOLIC PANEL
ALBUMIN: 4.2 g/dL (ref 3.5–5.0)
ALT: 32 U/L (ref 0–44)
AST: 15 U/L (ref 15–41)
Alkaline Phosphatase: 81 U/L (ref 38–126)
Anion gap: 19 — ABNORMAL HIGH (ref 5–15)
BUN: 6 mg/dL (ref 6–20)
CHLORIDE: 100 mmol/L (ref 98–111)
CO2: 12 mmol/L — AB (ref 22–32)
CREATININE: 1.3 mg/dL — AB (ref 0.61–1.24)
Calcium: 9 mg/dL (ref 8.9–10.3)
GFR calc Af Amer: >60 mL/min (ref >60)
GFR calc non Af Amer: >60 mL/min (ref >60)
GLUCOSE: 323 mg/dL — AB (ref 70–99)
Potassium: 3.5 mmol/L (ref 3.5–5.1)
SODIUM: 131 mmol/L — AB (ref 135–145)
Total Bilirubin: 1.6 mg/dL — ABNORMAL HIGH (ref 0.3–1.2)
Total Protein: 8.1 g/dL (ref 6.5–8.1)

## 2017-12-25 LAB — CBG MONITORING, ED
GLUCOSE-CAPILLARY: 203 mg/dL — AB (ref 70–99)
Glucose-Capillary: 324 mg/dL — ABNORMAL HIGH (ref 70–99)
Glucose-Capillary: 250 mg/dL — ABNORMAL HIGH (ref 70–99)

## 2017-12-25 LAB — LIPASE, BLOOD: LIPASE: 30 U/L (ref 11–51)

## 2017-12-25 LAB — TROPONIN I: Troponin I: <0.03 ng/mL (ref <0.03)

## 2017-12-25 LAB — MRSA PCR SCREENING: MRSA by PCR: NEGATIVE

## 2017-12-25 MED ORDER — ENOXAPARIN SODIUM 40 MG/0.4ML ~~LOC~~ SOLN: 40.0000 mg | SUBCUTANEOUS | Status: DC

## 2017-12-25 MED ORDER — SODIUM CHLORIDE 0.9 % IV SOLN: INTRAVENOUS | Status: DC

## 2017-12-25 MED ORDER — NICOTINE 14 MG/24HR TD PT24
14.0000 mg | MEDICATED_PATCH | Freq: Every day | TRANSDERMAL | Status: DC
  Filled 2017-12-25: qty 1

## 2017-12-25 MED ORDER — POTASSIUM CHLORIDE 10 MEQ/100ML IV SOLN
10.0000 meq | INTRAVENOUS | Status: AC
  Administered 2017-12-25 (×2): 10 meq via INTRAVENOUS
  Filled 2017-12-25 (×2): qty 100

## 2017-12-25 MED ORDER — INSULIN REGULAR(HUMAN) IN NACL 100-0.9 UT/100ML-% IV SOLN
INTRAVENOUS | Status: AC
  Administered 2017-12-25: 2.6 [IU]/h via INTRAVENOUS
  Filled 2017-12-25: qty 100

## 2017-12-25 MED ORDER — DEXTROSE-NACL 5-0.45 % IV SOLN
INTRAVENOUS | Status: AC
  Administered 2017-12-25: 18:00:00 via INTRAVENOUS

## 2017-12-25 MED ORDER — DEXTROSE 50 % IV SOLN: 25.0000 mL | INTRAVENOUS | Status: DC | PRN

## 2017-12-25 MED ORDER — SODIUM CHLORIDE 0.9 % IV BOLUS
1000.0000 mL | Freq: Once | INTRAVENOUS | Status: AC
  Administered 2017-12-25: 1000 mL via INTRAVENOUS

## 2017-12-25 MED ORDER — ONDANSETRON HCL 4 MG/2ML IJ SOLN
4.0000 mg | Freq: Once | INTRAMUSCULAR | Status: AC
  Administered 2017-12-25: 4 mg via INTRAVENOUS
  Filled 2017-12-25: qty 2

## 2017-12-25 NOTE — H&P (Signed)
Family Medicine Teaching [facility] Admission History and Physical Service Pager: [phone]  Patient name: Richard Gallegos Medical record number: [id number] Date of birth: [birth year] Age: 27 y.o. Gender: male  Primary Care Provider: Patient, No Pcp Per Consultants: cardiology Code Status: FULL  Chief Complaint: Nausea vomiting   Assessment and Plan: Richard Gallegos is a 27 y.o. male presenting with elevated blood sugar, metabolic acidosis, nausea vomiting. PMH is significant for no reported past medical history other than tobacco use.  Metabolic acidosis: Most likely new onset DM, no history of the same but reports father and grandfather with diabetes.  Has endorsed polydipsia and polyuria x2 to 3 weeks.  No sick contacts to suggest infectious etiology of the nausea vomiting.  No white count or fever.  No urinary symptoms other than frequency.  Elevated CBG at last ED encounter to 124.  80 glucose stabilizer started, will admit patient to stepdown for insulin drip. -Admit to stepdown attending Dr. Jennette Kettle -Continue insulin drip -Likely transition to p.o. and Lantus in the morning -BMPs q. 4 until anion gap closed x2 -anti GAD -C peptide   ST elevation on EKG: Abnormal telemetry noted by ED nurse, EKG captured ST elevation or questionable early repolarization.  Cardiology consulted by the ED who recommended serial troponins in a.m. EKG, a.m. Echo.  Denies personal cardiac history or family history of early cardiac death -Trend troponins -Echo  -UDS  Elevated blood pressure: No history of hypertension, however blood pressure is elevated on admission.  Continue to monitor and consider ARB on discharge with new diagnosis of diabetes for renal protection. -Monitor blood pressure  Tachycardia: Likely due to dehydration and acidosis.  No history of palpitations or cardiac diagnoses or chest pain. -Monitor as above  Tobacco use: One pack last him 3 days, also uses THC. -Nicotine  patch PRN  FEN/GI: NPO on insulin drip  Prophylaxis: lovenox  Disposition: admit to stepdown  History of Present Illness:  Richard Gallegos is a 27 y.o. male presenting with nausea vomiting since Thursday.  He thought he might have had food poisoning, although no one else around him is sick.  Denies diarrhea, cannot recall the last bowel movement.  He has 3 children at home which were well.  No fever or dysuria.  Does endorse urinary frequency as well as polydipsia x2 to 3 weeks.  Has a family history of diabetes including his father and grandfather, however no type I diabetics in the family that he knows of.  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  He also notes that he smokes and 1 pack lasts him 3 days.  He also endorses smoking marijuana, endorses occasional alcohol use not daily nor binging.  No other illicit drug use.  No family history of early cardiac death.  No personal medical history.  No chest pain or shortness of breath.  In the ED, labs consistent with diabetic ketoacidosis and glucose stabilizer begun.  EKG with?  ST elevation, cardiology consulted.  Review Of Systems: Per HPI with the following additions: Denies vision changes, headache, sore throat, cough, chest pain, shortness of breath, abdominal pain.  ROS  Patient Active Problem List   Diagnosis Date Noted  . Acidosis 12/25/2017    Past Medical History: History reviewed. No pertinent past medical history.  Past Surgical History: History reviewed. No pertinent surgical history.  Social History: Social History   Tobacco Use  . Smoking status:  Current Every Day Smoker    Packs/day: 0.50    Types: Cigarettes  . Smokeless tobacco: Never Used  Substance Use Topics  . Alcohol use: Yes    Comment: occasional  . Drug use: Yes    Frequency: 4.0 times per week    Types: Marijuana   Additional social history: Lives with partner and 3 children Please also refer  to relevant sections of EMR.  Family History: History reviewed. No pertinent family history. Diabetes in father and paternal grandfather  Allergies and Medications: No Known Allergies No current facility-administered medications on file prior to encounter.    Current Outpatient Medications on File Prior to Encounter  Medication Sig Dispense Refill  . Doxylamine-Pyridoxine (DICLEGIS) 10-10 MG TBEC Take 1 tablet by mouth daily as needed (nausea/vomiting).    . benzonatate (TESSALON) 100 MG capsule Take 1 capsule (100 mg total) by mouth every 8 (eight) hours. (Patient not taking: Reported on 12/25/2017) 21 capsule 0  . ondansetron (ZOFRAN) 4 MG tablet Take 1 tablet (4 mg total) by mouth every 8 (eight) hours as needed for nausea or vomiting. (Patient not taking: Reported on 12/25/2017) 8 tablet 0    Objective: BP (!) 141/98   Pulse (!) 110   Temp 98 F (36.7 C) (Oral)   Resp 17   Ht 6' (1.829 m)   Wt 122.5 kg   SpO2 100%   BMI 36.62 kg/m  Exam: General: Obese male sitting up in bed no acute distress Eyes: Pupils equal round reactive to light and accommodation extraocular movements intact ENTM: Moist mucous membranes Neck: Supple Cardiovascular: Tachycardic, no murmur Respiratory: Clear to auscultation bilaterally easy work of breathing good air movement Gastrointestinal: Soft nontender nondistended positive bowel sounds MSK: Normal muscle tone and bulk Derm: No lesions on visualized skin Neuro: Cranial nerves II through XII grossly intact Psych: Mood and affect appropriate  Labs and Imaging: CBC BMET  Recent Labs  Lab 12/25/17 1420  WBC 10.5  HGB 16.8  HCT 51.8  PLT 261   Recent Labs  Lab 12/25/17 1420  NA 131*  K 3.5  CL 100  CO2 12*  BUN 6  CREATININE 1.30*  GLUCOSE 323*  CALCIUM 9.0     UA positive ketones and glucose Lipase within normal limits VBG pH 7.24, bicarb 11  Garth Bigness, MD 12/25/2017, 6:42 PM PGY-3, [facility] FPTS Intern pager: [phone], text pages welcome

## 2017-12-25 NOTE — ED Provider Notes (Signed)
[facility] EMERGENCY DEPARTMENT Provider Note   CSN: [id number] Arrival date & time: 12/25/17  1338     History   Chief Complaint Chief Complaint  Patient presents with  . Emesis    HPI Richard Gallegos is a 27 y.o. male.  HPI   27 year old male presents today with complaints of nausea and vomiting.  Patient notes vomiting over the last 2 days unable to tolerate p.o.  Patient denies any associated diarrhea, abdominal pain, chest pain or shortness of breath.  He denies any infectious etiology.  He denies any history of the same or close sick contacts.  Patient denies any history of diabetes.  Denies any personal or close family cardiac history, notes that he has no history of hypertension diabetes.  Patient is a smoker.   History reviewed. No pertinent past medical history.  Patient Active Problem List   Diagnosis Date Noted  . Acidosis 12/25/2017    History reviewed. No pertinent surgical history.     Home Medications    Prior to Admission medications   Medication Sig Start Date End Date Taking? Authorizing Provider  Doxylamine-Pyridoxine (DICLEGIS) 10-10 MG TBEC Take 1 tablet by mouth daily as needed (nausea/vomiting).   Yes [provider]  benzonatate (TESSALON) 100 MG capsule Take 1 capsule (100 mg total) by mouth every 8 (eight) hours. Patient not taking: Reported on 12/25/2017 04/03/17   Couture, Cortni S, PA-C  ondansetron (ZOFRAN) 4 MG tablet Take 1 tablet (4 mg total) by mouth every 8 (eight) hours as needed for nausea or vomiting. Patient not taking: Reported on 12/25/2017 04/03/17   Karrie Meres, PA-C    Family History History reviewed. No pertinent family history.  Social History Social History   Tobacco Use  . Smoking status: Current Every Day Smoker    Packs/day: 0.50    Types: Cigarettes  . Smokeless tobacco: Never Used  Substance Use Topics  . Alcohol use: Yes    Comment: occasional  . Drug use: Yes   Frequency: 4.0 times per week    Types: Marijuana     Allergies   Patient has no known allergies.   Review of Systems Review of Systems  All other systems reviewed and are negative.  Physical Exam Updated Vital Signs BP (!) 141/98   Pulse (!) 110   Temp 98 F (36.7 C) (Oral)   Resp 17   Ht 6' (1.829 m)   Wt 122.5 kg   SpO2 100%   BMI 36.62 kg/m   Physical Exam  Constitutional: He is oriented to person, place, and time. He appears well-developed and well-nourished.  HENT:  Head: Normocephalic and atraumatic.  Eyes: Pupils are equal, round, and reactive to light. Conjunctivae are normal. Right eye exhibits no discharge. Left eye exhibits no discharge. No scleral icterus.  Neck: Normal range of motion. No JVD present. No tracheal deviation present.  Cardiovascular: Regular rhythm, normal heart sounds and intact distal pulses. Exam reveals no gallop and no friction rub.  No murmur heard. Pulmonary/Chest: Effort normal. No stridor.  Abdominal: Soft. He exhibits no distension and no mass. There is no tenderness. There is no rebound and no guarding. No hernia.  Neurological: He is alert and oriented to person, place, and time. Coordination normal.  Psychiatric: He has a normal mood and affect. His behavior is normal. Judgment and thought content normal.  Nursing note and vitals reviewed.   ED Treatments / Results  Labs (all labs ordered are listed, but  only abnormal results are displayed) Labs Reviewed  COMPREHENSIVE METABOLIC PANEL - Abnormal; Notable for the following components:      Result Value   Sodium 131 (*)    CO2 12 (*)    Glucose, Bld 323 (*)    Creatinine, Ser 1.30 (*)    Total Bilirubin 1.6 (*)    Anion gap 19 (*)    All other components within normal limits  CBC - Abnormal; Notable for the following components:   RBC 6.34 (*)    All other components within normal limits  URINALYSIS, ROUTINE W REFLEX MICROSCOPIC - Abnormal; Notable for the following  components:   Specific Gravity, Urine 1.031 (*)    Glucose, UA >=500 (*)    Hgb urine dipstick MODERATE (*)    Ketones, ur 80 (*)    Protein, ur 100 (*)    Bacteria, UA RARE (*)    All other components within normal limits  CBG MONITORING, ED - Abnormal; Notable for the following components:   Glucose-Capillary 324 (*)    All other components within normal limits  I-STAT VENOUS BLOOD GAS, ED - Abnormal; Notable for the following components:   pH, Ven 7.241 (*)    pCO2, Ven 27.4 (*)    Bicarbonate 11.8 (*)    TCO2 13 (*)    Acid-base deficit 14.0 (*)    All other components within normal limits  CBG MONITORING, ED - Abnormal; Notable for the following components:   Glucose-Capillary 250 (*)    All other components within normal limits  LIPASE, BLOOD  BLOOD GAS, VENOUS  RAPID URINE DRUG SCREEN, HOSP PERFORMED  C-PEPTIDE  GLUTAMIC ACID DECARBOXYLASE AUTO ABS  I-STAT TROPONIN, ED    EKG None  Radiology No results found.  Procedures Procedures (including critical care time)    Medications Ordered in ED Medications  insulin regular, human (MYXREDLIN) 100 units/ 100 mL infusion (3.8 Units/hr Intravenous Rate/Dose Change 12/25/17 1755)  potassium chloride 10 mEq in 100 mL IVPB (10 mEq Intravenous New Bag/Given 12/25/17 1755)  dextrose 5 %-0.45 % sodium chloride infusion ( Intravenous New Bag/Given 12/25/17 1756)  dextrose 50 % solution 25 mL (has no administration in time range)  sodium chloride 0.9 % bolus 1,000 mL (0 mLs Intravenous Stopped 12/25/17 1721)  ondansetron (ZOFRAN) injection 4 mg (4 mg Intravenous Given 12/25/17 1608)     Initial Impression / Assessment and Plan / ED Course  I have reviewed the triage vital signs and the nursing notes.  Pertinent labs & imaging results that were available during my care of the patient were reviewed by me and considered in my medical decision making (see chart for details).     Labs: I-STAT venous blood gas, i-STAT troponin,  CBG, urinalysis, lipase, CMP  Imaging:  Consults: Dr. Jodelle Green, Internal Medicine   Therapeutics: Insulin  Discharge Meds:   Assessment/Plan: 27 year old male presents today with DKA.  Patient with elevated anion gap at 19 decreased CO2 blood gas at 7.24.  He is very well-appearing in no acute distress, he has no signs of infectious etiology.  Patient had abnormal EKG findings, with ST elevation, question early re-pole.  Spoke with on-call cardiologist Dr. Jodelle Green who evaluated the EKG recommending repeat troponins and EKG although low suspicion for STEMI at this time.  Patient will need hospital admission for DKA.    Final Clinical Impressions(s) / ED Diagnoses   Final diagnoses:  Diabetic ketoacidosis without coma associated with other specified diabetes mellitus (HCC)  ED Discharge Orders    None       Rosalio Loud 12/25/17 1841    Tilden Fossa, MD 01/01/18 [phone]

## 2017-12-25 NOTE — Consult Note (Signed)
Cardiology Consultation:   Patient ID: Richard Gallegos MRN: [id number]; DOB: 05/11/1990  Admit date: 12/25/2017 Date of Consult: 12/25/2017  Primary Care Provider: Patient, No Pcp Per Primary Cardiologist:  New/Nishan Primary Electrophysiologist:  None     Patient Profile:   Richard Gallegos is a 27 y.o. male with no significant history  who is being seen today for the evaluation of abnormal ECG  at the request of Dr Jennette Kettle. .  History of Present Illness:   Mr. Buckner 27 y.o. being admitted with new diagnosis of DM and DKA. No previous history Noted increased frequency over last few weeks. Today had nausea and vomiting with BS over 300. No chest pain , dyspnea palpitations No history of congenital heart disease. Feels much better in ER with insulin and hydration. As well as K replacement ECG shows diffuse J point elevation particularly prominent in 3,F . No old ECG to compare. Drove to [place] [redacted] weeks ago no LE edema or pain. Smokes mariajuana but denies other drugs specifically amphetamines or cocaine   History reviewed. No pertinent past medical history.  History reviewed. No pertinent surgical history.   Home Medications:  Prior to Admission medications   Medication Sig Start Date End Date Taking? Authorizing Provider  benzonatate (TESSALON) 100 MG capsule Take 1 capsule (100 mg total) by mouth every 8 (eight) hours. 04/03/17   Couture, Cortni S, PA-C  guaiFENesin (MUCINEX) 600 MG 12 hr tablet Take 600 mg by mouth 2 (two) times daily as needed for cough.    [provider]  ondansetron (ZOFRAN) 4 MG tablet Take 1 tablet (4 mg total) by mouth every 8 (eight) hours as needed for nausea or vomiting. 04/03/17   Couture, Cortni S, PA-C  oseltamivir (TAMIFLU) 75 MG capsule Take 1 capsule (75 mg total) by mouth every 12 (twelve) hours. 04/03/17   Couture, Cortni S, PA-C  trimethoprim-polymyxin b (POLYTRIM) ophthalmic solution Place 2 drops into the left eye every 4 (four)  hours. Patient not taking: Reported on 04/03/2017 12/29/14   Tharon Aquas, PA    Inpatient Medications: Scheduled Meds:  Continuous Infusions: . dextrose 5 % and 0.45% NaCl 10 mL/hr at 12/25/17 1756  . insulin 3.8 Units/hr (12/25/17 1755)  . potassium chloride 10 mEq (12/25/17 1755)   PRN Meds:   Allergies:   No Known Allergies  Social History:   Social History   Socioeconomic History  . Marital status: Single    Spouse name: Not on file  . Number of children: Not on file  . Years of education: Not on file  . Highest education level: Not on file  Occupational History  . Not on file  Social Needs  . Financial resource strain: Not on file  . Food insecurity:    Worry: Not on file    Inability: Not on file  . Transportation needs:    Medical: Not on file    Non-medical: Not on file  Tobacco Use  . Smoking status: Current Every Day Smoker    Packs/day: 0.50    Types: Cigarettes  . Smokeless tobacco: Never Used  Substance and Sexual Activity  . Alcohol use: Yes    Comment: occasional  . Drug use: Yes    Frequency: 4.0 times per week    Types: Marijuana  . Sexual activity: Yes    Birth control/protection: None  Lifestyle  . Physical activity:    Days per week: Not on file    Minutes per session: Not  on file  . Stress: Not on file  Relationships  . Social connections:    Talks on phone: Not on file    Gets together: Not on file    Attends religious service: Not on file    Active member of club or organization: Not on file    Attends meetings of clubs or organizations: Not on file    Relationship status: Not on file  . Intimate partner violence:    Fear of current or ex partner: Not on file    Emotionally abused: Not on file    Physically abused: Not on file    Forced sexual activity: Not on file  Other Topics Concern  . Not on file  Social History Narrative  . Not on file    Family History:   History reviewed. No pertinent family history. Father had  HTN   ROS:  Please see the history of present illness.    All other ROS reviewed and negative.     Physical Exam/Data:   Vitals:   12/25/17 1359 12/25/17 1613 12/25/17 1637  BP: (!) 140/101 (!) 141/98   Pulse: (!) 108 (!) 110   Resp: 20 17   Temp: 98 F (36.7 C)    TempSrc: Oral    SpO2: 100% 100%   Weight:   122.5 kg  Height:   6' (1.829 m)    Intake/Output Summary (Last 24 hours) at 12/25/2017 1811 Last data filed at 12/25/2017 1743 Gross per 24 hour  Intake 1100 ml  Output -  Net 1100 ml   Filed Weights   12/25/17 1637  Weight: 122.5 kg   Body mass index is 36.62 kg/m.  General:  Well nourished, well developed, in no acute distress  HEENT: normal Lymph: no adenopathy Neck: no JVD Endocrine:  No thryomegaly Vascular: No carotid bruits; FA pulses 2+ bilaterally without bruits  Cardiac:  normal S1, S2; RRR; no murmur   Lungs:  clear to auscultation bilaterally, no wheezing, rhonchi or rales  Abd: soft, nontender, no hepatomegaly  Ext: no edema Musculoskeletal:  No deformities, BUE and BLE strength normal and equal Skin: warm and dry  Neuro:  CNs 2-12 intact, no focal abnormalities noted Psych:  Normal affect   EKG:  The EKG was personally reviewed and demonstrates:  NSR diffuse J point elevation  Particularly prominent in 3,F no PR depression  Telemetry:  Telemetry was personally reviewed and demonstrates:  NSR no arrhythmia   Relevant CV Studies: None   Laboratory Data:  Chemistry Recent Labs  Lab 12/25/17 1420  NA 131*  K 3.5  CL 100  CO2 12*  GLUCOSE 323*  BUN 6  CREATININE 1.30*  CALCIUM 9.0  GFRNONAA >60  GFRAA >60  ANIONGAP 19*    Recent Labs  Lab 12/25/17 1420  PROT 8.1  ALBUMIN 4.2  AST 15  ALT 32  ALKPHOS 81  BILITOT 1.6*   Hematology Recent Labs  Lab 12/25/17 1420  WBC 10.5  RBC 6.34*  HGB 16.8  HCT 51.8  MCV 81.7  MCH 26.5  MCHC 32.4  RDW 12.4  PLT 261   Cardiac EnzymesNo results for input(s): TROPONINI in  the last 168 hours.  Recent Labs  Lab 12/25/17 1725  TROPIPOC 0.00    BNPNo results for input(s): BNP, PROBNP in the last 168 hours.  DDimer No results for input(s): DDIMER in the last 168 hours.  Radiology/Studies:  No results found.  Assessment and Plan:   1. Abnormal ECG :  Suspect normal variant J point elevation in setting of dehydration and DKA more pronounced CXR normal with no cardiomegaly. Troponin pending No chest pain to suggest ischemic event or pericarditis / myocarditis. Will cycle troponon and ECG;s TTE tomorrow for LV function r/o effusion Check UDS make sure only positive for THC 2. DKA:  On insulin drip K replaced will need extensive teaching and home regimen        For questions or updates, please contact [facility] Please consult [email or web address] for contact info under     Signed, Charlton Haws, MD  12/25/2017 6:11 PM

## 2017-12-25 NOTE — ED Triage Notes (Signed)
Pt in c/o vomiting for the last few days, denies abdominal pain, no distress noted, states he cannot keep anything down

## 2017-12-26 ENCOUNTER — Other Ambulatory Visit: Payer: Self-pay

## 2017-12-26 ENCOUNTER — Inpatient Hospital Stay (HOSPITAL_COMMUNITY): Payer: Self-pay

## 2017-12-26 LAB — BASIC METABOLIC PANEL
ANION GAP: 16 — AB (ref 5–15)
ANION GAP: 11 (ref 5–15)
ANION GAP: 12 (ref 5–15)
ANION GAP: 12 (ref 5–15)
BUN: 6 mg/dL (ref 6–20)
BUN: 6 mg/dL (ref 6–20)
BUN: 6 mg/dL (ref 6–20)
BUN: 8 mg/dL (ref 6–20)
CALCIUM: 8.4 mg/dL — AB (ref 8.9–10.3)
CALCIUM: 8.4 mg/dL — AB (ref 8.9–10.3)
CALCIUM: 8.5 mg/dL — AB (ref 8.9–10.3)
CO2: 15 mmol/L — ABNORMAL LOW (ref 22–32)
CO2: 15 mmol/L — ABNORMAL LOW (ref 22–32)
CO2: 16 mmol/L — ABNORMAL LOW (ref 22–32)
CO2: 16 mmol/L — ABNORMAL LOW (ref 22–32)
Calcium: 8.4 mg/dL — ABNORMAL LOW (ref 8.9–10.3)
Chloride: 101 mmol/L (ref 98–111)
Chloride: 103 mmol/L (ref 98–111)
Chloride: 105 mmol/L (ref 98–111)
Chloride: 105 mmol/L (ref 98–111)
Creatinine, Ser: 1 mg/dL (ref 0.61–1.24)
Creatinine, Ser: 1.05 mg/dL (ref 0.61–1.24)
Creatinine, Ser: 1.06 mg/dL (ref 0.61–1.24)
Creatinine, Ser: 1.16 mg/dL (ref 0.61–1.24)
GFR calc Af Amer: >60 mL/min (ref >60)
GLUCOSE: 122 mg/dL — AB (ref 70–99)
GLUCOSE: 138 mg/dL — AB (ref 70–99)
GLUCOSE: 140 mg/dL — AB (ref 70–99)
Glucose, Bld: 277 mg/dL — ABNORMAL HIGH (ref 70–99)
POTASSIUM: 3.2 mmol/L — AB (ref 3.5–5.1)
POTASSIUM: 2.9 mmol/L — AB (ref 3.5–5.1)
POTASSIUM: 3 mmol/L — AB (ref 3.5–5.1)
POTASSIUM: 3.2 mmol/L — AB (ref 3.5–5.1)
SODIUM: 132 mmol/L — AB (ref 135–145)
Sodium: 131 mmol/L — ABNORMAL LOW (ref 135–145)
Sodium: 132 mmol/L — ABNORMAL LOW (ref 135–145)
Sodium: 132 mmol/L — ABNORMAL LOW (ref 135–145)

## 2017-12-26 LAB — RAPID URINE DRUG SCREEN, HOSP PERFORMED
Amphetamines: NOT DETECTED
BARBITURATES: NOT DETECTED
Benzodiazepines: NOT DETECTED
Cocaine: NOT DETECTED
Opiates: NOT DETECTED
TETRAHYDROCANNABINOL: POSITIVE — AB

## 2017-12-26 LAB — GLUCOSE, CAPILLARY
GLUCOSE-CAPILLARY: 107 mg/dL — AB (ref 70–99)
GLUCOSE-CAPILLARY: 179 mg/dL — AB (ref 70–99)
GLUCOSE-CAPILLARY: 234 mg/dL — AB (ref 70–99)
GLUCOSE-CAPILLARY: 233 mg/dL — AB (ref 70–99)
GLUCOSE-CAPILLARY: 275 mg/dL — AB (ref 70–99)
Glucose-Capillary: 109 mg/dL — ABNORMAL HIGH (ref 70–99)
Glucose-Capillary: 121 mg/dL — ABNORMAL HIGH (ref 70–99)
Glucose-Capillary: 123 mg/dL — ABNORMAL HIGH (ref 70–99)
Glucose-Capillary: 146 mg/dL — ABNORMAL HIGH (ref 70–99)
Glucose-Capillary: 154 mg/dL — ABNORMAL HIGH (ref 70–99)
Glucose-Capillary: 191 mg/dL — ABNORMAL HIGH (ref 70–99)
Glucose-Capillary: 206 mg/dL — ABNORMAL HIGH (ref 70–99)
Glucose-Capillary: 255 mg/dL — ABNORMAL HIGH (ref 70–99)

## 2017-12-26 LAB — HIV ANTIBODY (ROUTINE TESTING W REFLEX): HIV Screen 4th Generation wRfx: NONREACTIVE

## 2017-12-26 LAB — TROPONIN I
Troponin I: <0.03 ng/mL (ref <0.03)
Troponin I: <0.03 ng/mL (ref <0.03)

## 2017-12-26 LAB — HEMOGLOBIN A1C
HEMOGLOBIN A1C: 12 % — AB (ref 4.8–5.6)
Mean Plasma Glucose: 297.7 mg/dL

## 2017-12-26 LAB — TSH: TSH: 2.451 u[IU]/mL (ref 0.350–4.500)

## 2017-12-26 MED ORDER — PNEUMOCOCCAL VAC POLYVALENT 25 MCG/0.5ML IJ INJ
0.5000 mL | INJECTION | INTRAMUSCULAR | Status: AC
  Administered 2017-12-27: 0.5 mL via INTRAMUSCULAR
  Filled 2017-12-26: qty 0.5

## 2017-12-26 MED ORDER — INFLUENZA VAC SPLIT QUAD 0.5 ML IM SUSY
0.5000 mL | PREFILLED_SYRINGE | INTRAMUSCULAR | Status: AC
  Administered 2017-12-27: 0.5 mL via INTRAMUSCULAR
  Filled 2017-12-26: qty 0.5

## 2017-12-26 MED ORDER — INSULIN STARTER KIT- PEN NEEDLES (ENGLISH)
1.0000 | Freq: Once | Status: AC
  Administered 2017-12-26: 1
  Filled 2017-12-26: qty 1

## 2017-12-26 MED ORDER — ACETAMINOPHEN 325 MG PO TABS
650.0000 mg | ORAL_TABLET | Freq: Four times a day (QID) | ORAL | Status: DC | PRN
  Administered 2017-12-26 (×2): 650 mg via ORAL
  Filled 2017-12-26 (×2): qty 2

## 2017-12-26 MED ORDER — POTASSIUM CHLORIDE 10 MEQ/100ML IV SOLN
10.0000 meq | INTRAVENOUS | Status: AC
  Administered 2017-12-26 (×3): 10 meq via INTRAVENOUS
  Filled 2017-12-26 (×2): qty 100

## 2017-12-26 MED ORDER — INSULIN ASPART 100 UNIT/ML ~~LOC~~ SOLN
0.0000 [IU] | Freq: Three times a day (TID) | SUBCUTANEOUS | Status: DC
  Administered 2017-12-26 (×2): 3 [IU] via SUBCUTANEOUS
  Administered 2017-12-27 (×2): 5 [IU] via SUBCUTANEOUS

## 2017-12-26 MED ORDER — LIVING WELL WITH DIABETES BOOK
Freq: Once | Status: AC
  Administered 2017-12-26: 12:00:00
  Filled 2017-12-26: qty 1

## 2017-12-26 MED ORDER — INSULIN GLARGINE 100 UNIT/ML ~~LOC~~ SOLN
14.0000 [IU] | Freq: Every day | SUBCUTANEOUS | Status: DC
  Administered 2017-12-26 – 2017-12-27 (×2): 14 [IU] via SUBCUTANEOUS
  Filled 2017-12-26 (×2): qty 0.14

## 2017-12-26 NOTE — Care Management Note (Signed)
Case Management Note  Patient Details  Name: Richard Gallegos MRN: [id number] Date of Birth: January 07, 1991  Subjective/Objective:  Pt presented for nausea and vomiting, elevated blood sugar- found to be in DKA. Initiated on Insulin Gtt. Pt is without PCP and Insurance. Family Medicine will accept the patient to the clinic for Hospital Follow-up. Appointment will be placed on AVS.              Action/Plan: CM received consult for Assistance with [facility]- Since patient will be seen in the [facility]- CM will not be able to assist with the [facility]. CSW in the Clinic will need to assist with this process-MD is aware. CM did ask MD to e-scribe any new Rx's to the Transitions of Care Pharmacy-CM will continue to monitor for additional needs.   Expected Discharge Date:                  Expected Discharge Plan:  Home/Self Care  In-House Referral:  NA  Discharge planning Services  CM Consult, Indigent [facility] Clinic  Post Acute Care Choice:  NA Choice offered to:  NA  DME Arranged:  N/A DME Agency:  NA  HH Arranged:  NA HH Agency:  NA  Status of Service:  Completed, signed off  If discussed at Long Length of Stay Meetings, dates discussed:    Additional Comments:  Gala Lewandowsky, RN 12/26/2017, 2:58 PM

## 2017-12-26 NOTE — Progress Notes (Signed)
Provider on call paged to review BMP results. K 3.0 and Anion gap now 12-has repeat BMP in 4 hours. Pt complaining of HA. Provider on call paged with update. Awaiting return call. Will continue to monitor. Dierdre Highman, RN

## 2017-12-26 NOTE — Progress Notes (Signed)
   12/26/17 1000  Clinical Encounter Type  Visited With Patient and family together  Visit Type Initial  Responded to [facility] consult for AD. Patient said he did not need nor request an Advance Directive. Patient girlfriend at bedside. Pleasant visit.

## 2017-12-26 NOTE — Progress Notes (Signed)
S: The patient is a pleasant 27 year old male seen this morning resting in bed and reports feeling much better this morning.  He denies any chest pain, shortness of breath, cough, nausea, vomiting, polydipsia, polyuria, and polyphagia.  He reports feeling better overall and understands that he is now diagnosed with diabetes.  He is interested in modifying his diet and exercising, as well as taking insulin to keep his sugars in a healthy limit.    O: VS: Blood pressure 129/85, pulse 79, temperature 98.4 F (36.9 C), temperature source Oral, resp. rate 17, height 6' (1.829 m), weight 127.9 kg, SpO2 100 %. CBC CBC Latest Ref Rng & Units 12/25/2017 04/02/2017  WBC 4.0 - 10.5 K/uL 10.5 8.9  Hemoglobin 13.0 - 17.0 g/dL 76.1 95.0  Hematocrit 93.2 - 52.0 % 51.8 48.9  Platelets 150 - 400 K/uL 261 195    BMP Latest Ref Rng & Units 12/26/2017 12/26/2017 12/25/2017  Glucose 70 - 99 mg/dL 671(I) 458(K) 998(P)  BUN 6 - 20 mg/dL 6 6 8   Creatinine 0.61 - 1.24 mg/dL 3.82 5.05 3.97  Sodium 135 - 145 mmol/L 132(L) 132(L) 131(L)  Potassium 3.5 - 5.1 mmol/L 3.2(L) 2.9(L) 3.0(L)  Chloride 98 - 111 mmol/L 105 105 103  CO2 22 - 32 mmol/L 16(L) 15(L) 16(L)  Calcium 8.9 - 10.3 mg/dL 6.7(H) 4.1(P) 3.7(T)   Anion gap: 11 Urine drug screen: Positive THC  Assessment: Richard Gallegos is a 27 year old male with new onset diabetes and EKG with diffuse ST-segment elevation vs. normal variant for the patient.   Plan: -Obtain TTE today.  If normal, will not need further cardiac work-up -Insulin drip discontinued. Start Lantus 14 units basal and sSSI -Trend troponins: Negative so far. -Continue monitoring blood sugar and potassium. -K-Dur 40 mEq twice daily.  Peggyann Shoals, DO [facility], PGY-1 12/26/2017 12:35 PM

## 2017-12-26 NOTE — Progress Notes (Addendum)
Nutrition Consult - Education Note  RD consulted for nutrition education regarding diabetes.  Lab Results  Component Value Date   HGBA1C 12.0 (H) 12/26/2017   Student-dietitian provided "Type 2 Diabetes Nutrition Therapy" handout from the Academy of [employer] and Dietetics. Discussed different food groups and their effects on blood sugar, emphasizing carbohydrate-containing foods. Provided list of carbohydrates and recommended serving sizes of common foods.   Discussed importance of controlled and consistent carbohydrate intake throughout the day. Provided examples of ways to balance meals/snacks and encouraged intake of high-fiber, whole grain complex carbohydrates. Teach back method used.  Expect good compliance. Pt and spouse very receptive to learning information about managing new diagnosis.   Current diet order is regular; no meal completion has been recorded for pt, however he reports good appetite and intake. Labs and medications reviewed. No further nutrition interventions warranted at this time. If additional nutrition issues arise, please re-consult RD.   Richard Gallegos, Dietetic Intern [phone]

## 2017-12-26 NOTE — Progress Notes (Signed)
Progress Note  Patient Name: Richard Gallegos Date of Encounter: 12/26/2017  Primary Cardiologist: Eden Emms  Subjective   No cardiac symptoms  Inpatient Medications    Scheduled Meds: . enoxaparin (LOVENOX) injection  40 mg Subcutaneous Q24H  . [START ON 12/27/2017] Influenza vac split quadrivalent PF  0.5 mL Intramuscular Tomorrow-1000  . insulin aspart  0-9 Units Subcutaneous TID WC  . insulin glargine  14 Units Subcutaneous Daily  . nicotine  14 mg Transdermal Daily  . [START ON 12/27/2017] pneumococcal 23 valent vaccine  0.5 mL Intramuscular Tomorrow-1000   Continuous Infusions: . dextrose 5 % and 0.45% NaCl 10 mL/hr at 12/25/17 1756  . insulin 0.6 Units/hr (12/26/17 0827)   PRN Meds: acetaminophen, dextrose   Vital Signs    Vitals:   12/25/17 2012 12/26/17 0549 12/26/17 0749 12/26/17 0750  BP: 135/89 123/68 129/85   Pulse: 81 67  79  Resp: 17     Temp: 98.7 F (37.1 C) 98.4 F (36.9 C)  98.4 F (36.9 C)  TempSrc: Oral Oral  Oral  SpO2: 100% 100%  100%  Weight:  127.9 kg    Height:        Intake/Output Summary (Last 24 hours) at 12/26/2017 0908 Last data filed at 12/25/2017 1743 Gross per 24 hour  Intake 1100 ml  Output -  Net 1100 ml   Filed Weights   12/25/17 1637 12/26/17 0549  Weight: 122.5 kg 127.9 kg    Telemetry    NSR / ST - Personally Reviewed  ECG    NSR early repolarization diffuse J point elevation worse in 2,F - Personally Reviewed  Physical Exam  Black male  GEN: No acute distress.   Neck: No JVD Cardiac: RRR, no murmurs, rubs, or gallops.  Respiratory: Clear to auscultation bilaterally. GI: Soft, nontender, non-distended  MS: No edema; No deformity. Neuro:  Nonfocal  Psych: Normal affect   Labs    Chemistry Recent Labs  Lab 12/25/17 1420  12/25/17 2358 12/26/17 0223 12/26/17 0712  NA 131*   < > 131* 132* 132*  K 3.5   < > 3.0* 2.9* 3.2*  CL 100   < > 103 105 105  CO2 12*   < > 16* 15* 16*  GLUCOSE 323*   < >  140* 138* 122*  BUN 6   < > 8 6 6   CREATININE 1.30*   < > 1.16 1.05 1.00  CALCIUM 9.0   < > 8.4* 8.4* 8.5*  PROT 8.1  --   --   --   --   ALBUMIN 4.2  --   --   --   --   AST 15  --   --   --   --   ALT 32  --   --   --   --   ALKPHOS 81  --   --   --   --   BILITOT 1.6*  --   --   --   --   GFRNONAA >60   < > >60 >60 >60  GFRAA >60   < > >60 >60 >60  ANIONGAP 19*   < > 12 12 11    < > = values in this interval not displayed.     Hematology Recent Labs  Lab 12/25/17 1420  WBC 10.5  RBC 6.34*  HGB 16.8  HCT 51.8  MCV 81.7  MCH 26.5  MCHC 32.4  RDW 12.4  PLT 261  Cardiac Enzymes Recent Labs  Lab 12/25/17 1925 12/26/17 0223 12/26/17 0712  TROPONINI <0.03 <0.03 <0.03    Recent Labs  Lab 12/25/17 1725  TROPIPOC 0.00     BNPNo results for input(s): BNP, PROBNP in the last 168 hours.   DDimer No results for input(s): DDIMER in the last 168 hours.   Radiology    No results found.  Cardiac Studies   TTE pending   Patient Profile     27 y.o. male admitted with DKA nausea vomiting new diagnosis of DM. ECG abnormal with diffuse ST elevation especially in 2,F No cardiac symptoms or symptoms of pericarditis   Assessment & Plan    Abnormal ECG:  Likely normal variant for him. Troponin negative changes on ECG not labile No rub on exam And no cardiac symptoms TTE pending for today If normal will not need further cardiac w/u  DM:  Continues on insulin drip supplement K will need lots of education  [facility] will sign off.   Medication Recommendations:  None  Other recommendations (labs, testing, etc):  None  Follow up as an outpatient:  None   For questions or updates, please contact [facility] Please consult [email or web address] for contact info under        Signed, Charlton Haws, MD  12/26/2017, 9:08 AM

## 2017-12-26 NOTE — Progress Notes (Addendum)
Inpatient Diabetes Program Recommendations  AACE/ADA: New Consensus Statement on Inpatient Glycemic Control (2015)  Target Ranges:  Prepandial:   less than 140 mg/dL      Peak postprandial:   less than 180 mg/dL (1-2 hours)      Critically ill patients:  140 - 180 mg/dL   Lab Results  Component Value Date   GLUCAP 206 (H) 12/26/2017   HGBA1C 12.0 (H) 12/26/2017    Review of Glycemic Control Results for BOSTEN, NEWSTROM (MRN [id number]) as of 12/26/2017 09:54  Ref. Range 12/26/2017 06:01 12/26/2017 07:16 12/26/2017 08:25 12/26/2017 09:28  Glucose-Capillary Latest Ref Range: 70 - 99 mg/dL 146 (H) 123 (H) 121 (H) 206 (H)   Diabetes history: new onset Outpatient Diabetes medications: none Current orders for Inpatient glycemic control: Lantus 14 units QD, Novolog 0-9 units TID  Inpatient Diabetes Program Recommendations:    Noted new onset and consult.   MD consider: - Switching diet to carb modified.  - Adding meal coverage: Novolog 5 units TID (assuming that patient is consuming >50% of meal). - Increasing Lantus to 18 units QD (127 kg x 0.15).  Patient transitioned off of IV insulin to basal/bolus regimen. Of note, patient's CO2 this AM was 16, prior to transition. Thus, anticipate CBGs to remain elevated.  Will go ahead and order LWWDM and insulin starter kit.   Addendum@1152 : Spoke with the resident regarding plan of care. Informed of CO2 this AM and plan for transition. Discussed recommendations and would recommend CMET prior to discontinuation of insulin drip and anticipate CBGs will remain elevated given the possibility of patient still clearing acidosis. MD reporting good compliance thus far with diet and patient has been accepting of teaching. Will plan to see patient today.  Addendum @1400 : Spoke with patient and significant other regarding new onset diabetes.  Reviewed patient's current A1c of 12.0%. Explained what a A1c is and what it measures. Also reviewed goal A1c with  patient, importance of good glucose control @ home, and blood sugar goals. Reviewed patho of DM, need for insulin, fuel for energy, role of pancreas, DKA, ketones in urine, survival skills, basic differences between short vs long acting insulin, vascular changes and comorbidites that are associated from poor control.  Reviewed in detail diet and counting carbohydrates. Patient's significant other had multiple questions regarding keto diet. Educated patient on the value of adopting snack ideas, however, strongly encouraged patient to continue supplying his body with carbohydrates, specifically designated by dietitian. Discussed alternatives for sodas and encouraged to begin thinking of selecting foods of nutritional value.  Encouraged patient to check blood sugars 2-4 times per day. [employer] information provided. Patient has been able to self inject and prefers insulin pens.  Educated patient and significant other on insulin pen use at home. Reviewed contents of insulin flexpen starter kit. Reviewed all steps if insulin pen including attachment of needle, 2-unit air shot, dialing up dose, giving injection, removing needle, disposal of sharps, storage of unused insulin, disposal of insulin etc. Patient able to provide successful return demonstration. Also reviewed troubleshooting with insulin pen. MD to give patient Rxs for insulin pens and insulin pen needles.  Needs for education: -Survival skill review -Sick day rules - Injection schedule   Thanks, Bronson Curb, MSN, RNC-OB Diabetes Coordinator [phone] (8a-5p)

## 2017-12-27 ENCOUNTER — Telehealth: Payer: Self-pay | Admitting: Family Medicine

## 2017-12-27 ENCOUNTER — Other Ambulatory Visit (HOSPITAL_COMMUNITY): Payer: Self-pay

## 2017-12-27 ENCOUNTER — Inpatient Hospital Stay (HOSPITAL_COMMUNITY): Payer: Self-pay

## 2017-12-27 DIAGNOSIS — R9431 Abnormal electrocardiogram [ECG] [EKG]: Secondary | ICD-10-CM

## 2017-12-27 LAB — BASIC METABOLIC PANEL
ANION GAP: 12 (ref 5–15)
BUN: 5 mg/dL — ABNORMAL LOW (ref 6–20)
CO2: 18 mmol/L — AB (ref 22–32)
Calcium: 8.6 mg/dL — ABNORMAL LOW (ref 8.9–10.3)
Chloride: 103 mmol/L (ref 98–111)
Creatinine, Ser: 1.01 mg/dL (ref 0.61–1.24)
GFR calc Af Amer: >60 mL/min (ref >60)
GFR calc non Af Amer: >60 mL/min (ref >60)
GLUCOSE: 253 mg/dL — AB (ref 70–99)
POTASSIUM: 3.2 mmol/L — AB (ref 3.5–5.1)
Sodium: 133 mmol/L — ABNORMAL LOW (ref 135–145)

## 2017-12-27 LAB — CBC
HCT: 43.9 % (ref 39.0–52.0)
HEMOGLOBIN: 14.8 g/dL (ref 13.0–17.0)
MCH: 27.1 pg (ref 26.0–34.0)
MCHC: 33.7 g/dL (ref 30.0–36.0)
MCV: 80.3 fL (ref 80.0–100.0)
NRBC: 0 % (ref 0.0–0.2)
Platelets: 199 10*3/uL (ref 150–400)
RBC: 5.47 MIL/uL (ref 4.22–5.81)
RDW: 12.4 % (ref 11.5–15.5)
WBC: 9.4 10*3/uL (ref 4.0–10.5)

## 2017-12-27 LAB — GLUCOSE, CAPILLARY
Glucose-Capillary: 252 mg/dL — ABNORMAL HIGH (ref 70–99)
Glucose-Capillary: 259 mg/dL — ABNORMAL HIGH (ref 70–99)

## 2017-12-27 LAB — GLUTAMIC ACID DECARBOXYLASE AUTO ABS

## 2017-12-27 LAB — C-PEPTIDE: C-Peptide: 0.8 ng/mL — ABNORMAL LOW (ref 1.1–4.4)

## 2017-12-27 LAB — ECHOCARDIOGRAM COMPLETE
HEIGHTINCHES: 72 in
WEIGHTICAEL: 4496 [oz_av]

## 2017-12-27 MED ORDER — RELION LANCING DEVICE KIT: 1.0000 | PACK | Freq: Once | 0 refills | Status: DC

## 2017-12-27 MED ORDER — INSULIN GLARGINE 100 UNIT/ML ~~LOC~~ SOLN: 18.0000 [IU] | Freq: Every day | SUBCUTANEOUS | 11 refills | Status: DC

## 2017-12-27 MED ORDER — POTASSIUM CHLORIDE CRYS ER 20 MEQ PO TBCR
40.0000 meq | EXTENDED_RELEASE_TABLET | Freq: Two times a day (BID) | ORAL | Status: AC
  Administered 2017-12-27 (×2): 40 meq via ORAL
  Filled 2017-12-27 (×2): qty 2

## 2017-12-27 MED ORDER — RELION ULTRA THIN LANCETS 30G MISC: 1.0000 | 0 refills | Status: DC

## 2017-12-27 MED ORDER — GLUCOSE BLOOD VI STRP: ORAL_STRIP | 0 refills | Status: DC

## 2017-12-27 MED ORDER — RELION LANCING DEVICE KIT: 1.0000 | PACK | Freq: Once | 0 refills | Status: AC

## 2017-12-27 MED ORDER — GLUCOSE BLOOD VI STRP: ORAL_STRIP | 0 refills | Status: AC

## 2017-12-27 MED ORDER — NICOTINE 14 MG/24HR TD PT24: 14.0000 mg | MEDICATED_PATCH | Freq: Every day | TRANSDERMAL | 0 refills | Status: AC

## 2017-12-27 MED ORDER — RELION PRIME MONITOR DEVI: 1.0000 | Freq: Once | 0 refills | Status: AC

## 2017-12-27 MED ORDER — RELION PRIME MONITOR DEVI: 1.0000 | Freq: Once | 0 refills | Status: DC

## 2017-12-27 MED ORDER — METFORMIN HCL ER 500 MG PO TB24: 500.0000 mg | ORAL_TABLET | Freq: Every day | ORAL | 11 refills | Status: DC

## 2017-12-27 MED ORDER — RELION ULTRA THIN LANCETS 30G MISC: 1.0000 | 0 refills | Status: AC

## 2017-12-27 MED FILL — METFORMIN HCL ER 500 MG TAB: 500 | 30 days supply | Qty: 30 | Fill #0

## 2017-12-27 NOTE — Progress Notes (Signed)
Inpatient Diabetes Program Recommendations  AACE/ADA: New Consensus Statement on Inpatient Glycemic Control (2015)  Target Ranges:  Prepandial:   less than 140 mg/dL      Peak postprandial:   less than 180 mg/dL (1-2 hours)      Critically ill patients:  140 - 180 mg/dL   Lab Results  Component Value Date   GLUCAP 252 (H) 12/27/2017   HGBA1C 12.0 (H) 12/26/2017    Review of Glycemic Control Results for Richard Gallegos, Richard Gallegos (MRN [id number]) as of 12/27/2017 09:02  Ref. Range 12/26/2017 16:00 12/26/2017 21:29 12/27/2017 07:47  Glucose-Capillary Latest Ref Range: 70 - 99 mg/dL 409 (H) 811 (H) 914 (H)   Diabetes history: new onset Outpatient Diabetes medications: none Current orders for Inpatient glycemic control: Lantus 14 units QD, Novolog 0-9 units TID  Inpatient Diabetes Program Recommendations:   Spoke with resident regarding outpatient plan for DM insulin regimen. MD to provide insulin at discharge and social worker will work on coverage at hospital follow up. Discussed plan to increase Lantus to 18 units QD, then assess for meal coverage/correction at next office visit.   Spoke with patient again regarding new onset DM. Reviewed LWWDM book, insulin pens, insulin administration schedule, survival skills, differences between basal and short acting, when to call MD, and sick day rules.  Reminded patient and significant other of options at [facility] through [employer] for meter and supplies. Discussed on CGM options such as [employer] to include: cost, benefits, censors and readers. Patient able to verbalize interventions and has no further questions at this time. Plans to follow up with PCP.   Thanks, Richard Rave, MSN, RNC-OB Diabetes Coordinator [phone] (8a-5p)

## 2017-12-27 NOTE — Progress Notes (Signed)
Pt states he had emesis x1.  States he was a little nausea prior to emesis.  Denies nausea currently.  Denies chest pain. VSS. CBG 259. Pt self injected well.  Hinton Dyer, RN

## 2017-12-27 NOTE — Progress Notes (Signed)
  Echocardiogram 2D Echocardiogram has been performed.  Gerda Diss 12/27/2017, 11:09 AM

## 2017-12-27 NOTE — Discharge Summary (Signed)
[facility] Discharge Summary  Patient name: Richard Gallegos Medical record number: [id number] Date of birth: 1990/06/05 Age: 27 y.o. Gender: male Date of Admission: 12/25/2017  Date of Discharge: 12/27/2017 Admitting Physician: Dickie La, MD  Primary Care Provider: Sela Hilding, MD Consultants: Cardiology, Nutrition  Indication for Hospitalization: Polydipsia, Polyuria, Nausea, Vomiting  Discharge Diagnoses/Problem List:  Diabetes Polysubstance Use [facility])  Disposition: Discharge home with close outpatient follow up  Discharge Condition: Stable  Discharge Exam:  Physical Exam  Constitutional: He appears well-developed and well-nourished.  Pleasant  HENT:  Head: Normocephalic and atraumatic.  Eyes: EOM are normal.  Neck: Normal range of motion.  Cardiovascular: Normal rate, regular rhythm and normal heart sounds.  Pulmonary/Chest: Effort normal and breath sounds normal.  Lymphadenopathy:    He has no cervical adenopathy.   Brief Hospital Course:  Richard Gallegos is a pleasant 27 year old male admitted for nausea and vomiting found to have elevated blood sugar and be in metabolic acidosis. He was diagnosed with new-onset diabetes. He was started on an insulin drip and transitioned to Lantus 14U daily once his anion gap closed. The patient was provided diabetes education and his blood sugars remained stable in the 200s. His overall clinical picture improved and he was discharged home on Lantus 18 U daily with close outpatient follow up.    When he was admitted the patient was noted to have ST-elevation on EKG. Cardiology was consulted. Serial troponins were tracked and remained negative. The Echocardiogram was normal and showed 55-60% EF and the patient was medically cleared for discharge home without cardiology follow up.   Issues for Follow Up:  1. Continue to monitor blood sugar at least twice daily.  2. Increase Lantus dosing as needed  for blood sugar control.  Significant Procedures: none  Significant Labs and Imaging:  Recent Labs  Lab 12/25/17 1420 12/27/17 0418  WBC 10.5 9.4  HGB 16.8 14.8  HCT 51.8 43.9  PLT 261 199   Recent Labs  Lab 12/25/17 1420  12/25/17 2358 12/26/17 0223 12/26/17 0712 12/26/17 1844 12/27/17 0418  NA 131*   < > 131* 132* 132* 132* 133*  K 3.5   < > 3.0* 2.9* 3.2* 3.2* 3.2*  CL 100   < > 103 105 105 101 103  CO2 12*   < > 16* 15* 16* 15* 18*  GLUCOSE 323*   < > 140* 138* 122* 277* 253*  BUN 6   < > 8 6 6 6  5*  CREATININE 1.30*   < > 1.16 1.05 1.00 1.06 1.01  CALCIUM 9.0   < > 8.4* 8.4* 8.5* 8.4* 8.6*  ALKPHOS 81  --   --   --   --   --   --   AST 15  --   --   --   --   --   --   ALT 32  --   --   --   --   --   --   ALBUMIN 4.2  --   --   --   --   --   --    < > = values in this interval not displayed.   Echo 11/6: pending UDS: + THC Troponins: negative x3 HbA1c: 12% TSH: 2.451 C-Peptide: 0.8 L GAD 11/5: <5 mL  Results/Tests Pending at Time of Discharge: GAD: <5 nml  Discharge Medications:  Allergies as of 12/27/2017   No Known Allergies  Medication List    STOP taking these medications   benzonatate 100 MG capsule Commonly known as:  TESSALON   DICLEGIS 10-10 MG Tbec Generic drug:  Doxylamine-Pyridoxine   ondansetron 4 MG tablet Commonly known as:  ZOFRAN     TAKE these medications   glucose blood test strip Use as instructed   insulin glargine 100 UNIT/ML injection Commonly known as:  LANTUS Inject 0.18 mLs (18 Units total) into the skin daily.   metFORMIN 500 MG 24 hr tablet Commonly known as:  GLUCOPHAGE-XR Take 1 tablet (500 mg total) by mouth daily with breakfast.   nicotine 14 mg/24hr patch Commonly known as:  NICODERM CQ - dosed in mg/24 hours Place 1 patch (14 mg total) onto the skin daily.   RELION ULTRA THIN LANCETS 30G Misc 1 Device by Does not apply route as directed.     ASK your doctor about these medications   RELION  LANCING DEVICE Kit 1 Device by Does not apply route once for 1 dose. Ask about: Should I take this medication?   RELION PRIME MONITOR Devi 1 Device by Does not apply route once for 1 dose. Ask about: Should I take this medication?       Discharge Instructions:  Use your insulin every morning. Check your blood sugars before you eat anything in the morning. Bring the meter to all of your appointments.  [place]:  Diet Recommendations for Diabetes   Starchy (carb) foods: Bread, rice, pasta, potatoes, corn, cereal, grits, crackers, bagels, muffins, all baked goods. (Fruits, milk, and yogurt also have carbohydrate, but most of these foods will not spike your blood sugar as most starchy foods will.) A few fruits do cause high blood sugars; use small portions of bananas (limit to 1/2 at a time), grapes, watermelon, oranges, and most tropical fruits.   Protein foods: Meat, fish, poultry, eggs, dairy foods, and beans such as pinto and kidney beans (beans also provide carbohydrate).   1. Eat at least 3 meals and 1-2 snacks per day. Never go more than 4-5 hours while awake without eating. Eat breakfast within the first hour of getting up.  2. Limit starchy foods to TWO per meal and ONE per snack. ONE portion of a starchy food is equal to the following:  - ONE slice of bread (or its equivalent, such as half of a hamburger bun).  - 1/2 cup of a "scoopable" starchy food such as potatoes or rice.  - 15 grams of Total Carbohydrate as shown on food label.  3. Include at every meal: a protein food, a carb food, and vegetables and/or fruit.  - Obtain twice the volume of veg's as protein or carbohydrate foods for both lunch and dinner.  - Fresh or frozen veg's are best.  - Keep frozen veg's on hand for a quick vegetable serving.    Follow-Up Appointments: Follow-up Information    [place]. Go on 01/01/2018.   Why:  11am for hospital follow up - bring  meter and blood sugar log Contact information: [place]       Dr. Valentina Lucks. Go on 01/15/2018.   Why:  diabetes medication review - bring sugar log and meter Contact information: [place] [address] [phone]          Daisy Floro, DO 12/30/2017, 10:51 AM PGY-1, [place]

## 2017-12-27 NOTE — Progress Notes (Signed)
Progress Note  Patient Name: Richard Gallegos Date of Encounter: 12/27/2017  Primary Cardiologist: Eden Emms  Subjective   No cardiac symptoms  Inpatient Medications    Scheduled Meds: . enoxaparin (LOVENOX) injection  40 mg Subcutaneous Q24H  . Influenza vac split quadrivalent PF  0.5 mL Intramuscular Tomorrow-1000  . insulin aspart  0-9 Units Subcutaneous TID WC  . insulin glargine  14 Units Subcutaneous Daily  . nicotine  14 mg Transdermal Daily  . pneumococcal 23 valent vaccine  0.5 mL Intramuscular Tomorrow-1000  . potassium chloride  40 mEq Oral BID   Continuous Infusions:  PRN Meds: acetaminophen, dextrose   Vital Signs    Vitals:   12/26/17 1602 12/26/17 1947 12/27/17 0017 12/27/17 0438  BP: 128/81 (!) 143/82 130/73 137/83  Pulse: 78 90 87 80  Resp:  18 18 17   Temp: 97.8 F (36.6 C) 98.3 F (36.8 C) 97.7 F (36.5 C) 97.9 F (36.6 C)  TempSrc: Oral Oral Oral Oral  SpO2: 100% 97% 100% 100%  Weight:    127.5 kg  Height:        Intake/Output Summary (Last 24 hours) at 12/27/2017 0820 Last data filed at 12/27/2017 0300 Gross per 24 hour  Intake 77.16 ml  Output 650 ml  Net -572.84 ml   Filed Weights   12/25/17 1637 12/26/17 0549 12/27/17 0438  Weight: 122.5 kg 127.9 kg 127.5 kg    Telemetry    NSR / ST - Personally Reviewed  ECG    NSR early repolarization diffuse J point elevation worse in 2,F - Personally Reviewed  Physical Exam  Black male  GEN: No acute distress.   Neck: No JVD Cardiac: RRR, no murmurs, rubs, or gallops.  Respiratory: Clear to auscultation bilaterally. GI: Soft, nontender, non-distended  MS: No edema; No deformity. Neuro:  Nonfocal  Psych: Normal affect   Labs    Chemistry Recent Labs  Lab 12/25/17 1420  12/26/17 0712 12/26/17 1844 12/27/17 0418  NA 131*   < > 132* 132* 133*  K 3.5   < > 3.2* 3.2* 3.2*  CL 100   < > 105 101 103  CO2 12*   < > 16* 15* 18*  GLUCOSE 323*   < > 122* 277* 253*  BUN 6   < > 6  6 5*  CREATININE 1.30*   < > 1.00 1.06 1.01  CALCIUM 9.0   < > 8.5* 8.4* 8.6*  PROT 8.1  --   --   --   --   ALBUMIN 4.2  --   --   --   --   AST 15  --   --   --   --   ALT 32  --   --   --   --   ALKPHOS 81  --   --   --   --   BILITOT 1.6*  --   --   --   --   GFRNONAA >60   < > >60 >60 >60  GFRAA >60   < > >60 >60 >60  ANIONGAP 19*   < > 11 16* 12   < > = values in this interval not displayed.     Hematology Recent Labs  Lab 12/25/17 1420 12/27/17 0418  WBC 10.5 9.4  RBC 6.34* 5.47  HGB 16.8 14.8  HCT 51.8 43.9  MCV 81.7 80.3  MCH 26.5 27.1  MCHC 32.4 33.7  RDW 12.4 12.4  PLT 261 199  Cardiac Enzymes Recent Labs  Lab 12/25/17 1925 12/26/17 0223 12/26/17 0712  TROPONINI <0.03 <0.03 <0.03    Recent Labs  Lab 12/25/17 1725  TROPIPOC 0.00     BNPNo results for input(s): BNP, PROBNP in the last 168 hours.   DDimer No results for input(s): DDIMER in the last 168 hours.   Radiology    No results found.  Cardiac Studies   TTE pending   Patient Profile     27 y.o. male admitted with DKA nausea vomiting new diagnosis of DM. ECG abnormal with diffuse ST elevation especially in 2,F No cardiac symptoms or symptoms of pericarditis   Assessment & Plan    Abnormal ECG:  Likely normal variant for him. Troponin negative changes on ECG not labile No rub on exam And no cardiac symptoms  TTE not done yesterday have called lab and re ordered as "anticipate" d/c  If normal will not need further cardiac w/u  DM:  Continues on insulin drip supplement K will need lots of education  [facility] will sign off.   Medication Recommendations:  None  Other recommendations (labs, testing, etc):  None  Follow up as an outpatient:  None   For questions or updates, please contact [facility] Please consult [email or web address] for contact info under        Signed, Charlton Haws, MD  12/27/2017, 8:20 AM

## 2017-12-27 NOTE — Telephone Encounter (Signed)
Ordering Lantus sample (from our clinic) and glucometer devices (will need to buy at [facility] unless Dr. Raymondo Band has samples).

## 2017-12-31 NOTE — Progress Notes (Deleted)
   Subjective:    Patient ID: Richard Gallegos, male    DOB: 03-28-1990, 28 y.o.   MRN: [id number]   CC: hosp f/u  HPI: Hospital f/u - new dx DM Admitted from 11/4 - 11/6 and diagnosed with new diagnosis of diabetes. During hospitalization patient was placed on insulin gtt and transitioned to Lantus 14U daily. Discharged with Lantus 18U daily.   During hospitalization patient seen by Cardiology for ST elevation on EKG. Echo showed EF 55-60% and patient was cleared for dc with cardiology f/u.   Richard Gallegos is a pleasant 27 year old male admitted for nausea and vomiting found to have elevated blood sugar and be in metabolic acidosis. He was diagnosed with new-onset diabetes. He was started on an insulin drip and transitioned to Lantus 14U daily once his anion gap closed. The patient was provided diabetes education and his blood sugars remained stable in the 200s. His overall clinical picture improved and he was discharged home on Lantus 18 U daily with close outpatient follow up.    When he was admitted the patient was noted to have ST-elevation on EKG. Cardiology was consulted. Serial troponins were tracked and remained negative. The Echocardiogram was normal and showed 55-60% EF and the patient was medically cleared for discharge home without cardiology follow up.   Issues for Follow Up:  1. Continue to monitor blood sugar at least twice daily.  2. Increase Lantus dosing as needed for blood sugar control.  Smoking status reviewed  Review of Systems   Objective:  There were no vitals taken for this visit. Vitals and nursing note reviewed  General: well nourished, in no acute distress HEENT: normocephalic, TM's visualized bilaterally, no scleral icterus or conjunctival pallor, no nasal discharge, moist mucous membranes, good dentition without erythema or discharge noted in posterior oropharynx Neck: supple, non-tender, without lymphadenopathy Cardiac: RRR, clear S1 and S2, no  murmurs, rubs, or gallops Respiratory: clear to auscultation bilaterally, no increased work of breathing Abdomen: soft, nontender, nondistended, no masses or organomegaly. Bowel sounds present Extremities: no edema or cyanosis. Warm, well perfused. 2+ radial and PT pulses bilaterally Skin: warm and dry, no rashes noted Neuro: alert and oriented, no focal deficits   Assessment & Plan:    No problem-specific Assessment & Plan notes found for this encounter.    No follow-ups on file.   Oralia Manis, DO, PGY-2

## 2018-01-01 ENCOUNTER — Ambulatory Visit: Payer: Self-pay

## 2018-01-02 ENCOUNTER — Other Ambulatory Visit: Payer: Self-pay

## 2018-01-02 ENCOUNTER — Ambulatory Visit (INDEPENDENT_AMBULATORY_CARE_PROVIDER_SITE_OTHER): Payer: Self-pay | Admitting: Family Medicine

## 2018-01-02 VITALS — BP 120/70 | HR 83 | Temp 98.0°F | Ht 73.0 in | Wt 290.0 lb

## 2018-01-02 DIAGNOSIS — E119 Type 2 diabetes mellitus without complications: Secondary | ICD-10-CM

## 2018-01-02 DIAGNOSIS — Z794 Long term (current) use of insulin: Secondary | ICD-10-CM

## 2018-01-02 NOTE — Patient Instructions (Signed)
It was a pleasure to see you today! Thank you for choosing [facility] for your primary care. Richard Gallegos was seen for diabetes followup. Come back to the clinic within a month if your blood sugars haven't gotten below 200 by then, and go to the emergency room if you have any life threatening symptoms.   You have a new diabetes diagnosis but have been doing great keeping track of your sugars and your fiance' has been wonderful helping with your diet.  We'd like you to keep taking you blood sugars 3xday.  If for 2 days straight your blood sugars are all over 200, we want you to add 2units to your lantus dose.  You can start tomorrow with 22units and go up from there.  Once your sugars have started getting into the 100s, come back to see us to talk about further dosing.  We also want you to call around and check on prices for lantus, it can be expensive so if you can't afford it we can try dosing [facility] insulin for you.  It can be a lot more work but it is often less expensive.  Don't forget to go get your diabetic eye exam, we're giving you a list of local doctors.    Please bring all your medications to every doctors visit   Sign up for My Chart to have easy access to your labs results, and communication with your Primary care physician.     Please check-out at the front desk before leaving the clinic.     Best,  Dr. Marthenia RollingScott Chrisanne Loose FAMILY MEDICINE RESIDENT - PGY2 01/02/2018 3:42 PM

## 2018-01-09 DIAGNOSIS — E119 Type 2 diabetes mellitus without complications: Secondary | ICD-10-CM | POA: Insufficient documentation

## 2018-01-09 DIAGNOSIS — Z794 Long term (current) use of insulin: Principal | ICD-10-CM

## 2018-01-09 MED ORDER — INSULIN GLARGINE 100 UNIT/ML ~~LOC~~ SOLN: 22.0000 [IU] | Freq: Every day | SUBCUTANEOUS | 11 refills | Status: DC

## 2018-01-09 NOTE — Assessment & Plan Note (Addendum)
We'd like you to keep taking you blood sugars 3xday.  Your prior dose was 18u lantus but your cbgs were still high.   If for 2 days straight your blood sugars are all over 200, we want you to add 2units to your lantus dose.  You can start tomorrow with 22units and go up from there.  Once your sugars have started getting into the 100s, come back to see us to talk about further dosing.  Also, will check price of lantus at pharmacy to see if we will need to talk about transition to 70/30 long term until he gets coverage  Also given ophthalmologist list get diabetic eye exam, no complaints at this time.

## 2018-01-09 NOTE — Progress Notes (Signed)
    Subjective:  Richard Gallegos is a 27 y.o. male who [facility] with a chief complaint of new onset DM hospital f/u.   HPI: Patient with recent discharge after new DM diagnosis.  Now on lantus samples and metformin and keeping up with his cbgs 3x day (still over 200 consistently).  Has been on lantus 18u daily and is consistent.  Fiance has been helping to make dietary changes and he is optimistic.  He feels comfortable giving his lantus shots and has had no hypoglycemic events.  Tolerating metformin well.   Objective:  Physical Exam: BP 120/70 (BP Location: Left Arm, Patient Position: Sitting, Cuff Size: Large)   Pulse 83   Temp 98 F (36.7 C)   Ht 6\' 1"  (1.854 m)   Wt 290 lb (131.5 kg)   SpO2 99%   BMI 38.26 kg/m   Gen: NAD, resting comfortably, obese CV: RRR with no murmurs appreciated Pulm: NWOB, CTAB with no crackles, wheezes, or rhonchi GI: Normal bowel sounds present. Soft, Nontender, Nondistended. MSK: no edema, cyanosis, or clubbing noted Skin: warm, dry Neuro: grossly normal, moves all extremities Psych: Normal affect and thought content  No results found for this or any previous visit (from the past 72 hour(s)).   Assessment/Plan:  Type 2 diabetes mellitus without complication, with long-term current use of insulin (HCC) We'd like you to keep taking you blood sugars 3xday.  Your prior dose was 18u lantus but your cbgs were still high.   If for 2 days straight your blood sugars are all over 200, we want you to add 2units to your lantus dose.  You can start tomorrow with 22units and go up from there.  Once your sugars have started getting into the 100s, come back to see us to talk about further dosing.  Also, will check price of lantus at pharmacy to see if we will need to talk about transition to 70/30 long term until he gets coverage  Also given ophthalmologist list get diabetic eye exam, no complaints at this time.   Richard RollingScott Win Guajardo, DO FAMILY  MEDICINE RESIDENT - PGY2 01/09/2018 10:57 AM

## 2018-01-11 ENCOUNTER — Other Ambulatory Visit: Payer: Self-pay

## 2018-01-11 MED ORDER — INSULIN GLARGINE 100 UNIT/ML SOLOSTAR PEN: PEN_INJECTOR | SUBCUTANEOUS | 99 refills | Status: DC

## 2018-01-11 NOTE — Telephone Encounter (Signed)
Patient called and Lantus has not been sent to pharmacy. Upon review, Rx was set as "sample" and not "normal". Also was supposed to be pen, not vial.  New rx sent.  Ples SpecterAlisa , RN [facility] RN)

## 2018-01-15 ENCOUNTER — Ambulatory Visit: Payer: Self-pay | Admitting: Pharmacist

## 2018-01-25 ENCOUNTER — Encounter: Payer: Self-pay | Admitting: Pharmacist

## 2018-01-25 ENCOUNTER — Ambulatory Visit (INDEPENDENT_AMBULATORY_CARE_PROVIDER_SITE_OTHER): Payer: Self-pay | Admitting: Pharmacist

## 2018-01-25 VITALS — BP 110/68 | HR 72 | Ht 72.0 in | Wt 285.8 lb

## 2018-01-25 DIAGNOSIS — E119 Type 2 diabetes mellitus without complications: Secondary | ICD-10-CM

## 2018-01-25 DIAGNOSIS — Z794 Long term (current) use of insulin: Secondary | ICD-10-CM

## 2018-01-25 DIAGNOSIS — Z720 Tobacco use: Secondary | ICD-10-CM

## 2018-01-25 MED ORDER — INSULIN GLARGINE 100 UNIT/ML SOLOSTAR PEN: 24.0000 [IU] | PEN_INJECTOR | Freq: Every day | SUBCUTANEOUS | 0 refills | Status: DC

## 2018-01-25 NOTE — Progress Notes (Signed)
    S:     Chief Complaint  Patient presents with  . Medication Management    Diabetes    Patient arrives in good spirits, ambulating without assistance AND accompanied by his Fiance.   Presents for diabetes evaluation, education, and management at the request of PCP Dr. Chanetta Marshallimberlake at recent hospitalization for DKA on 12/25/2017. He was seen by Dr. Parke SimmersBland on 01/02/2018 where he was given a self titration instruction for Lantus.   Today, he reports significant improvement in blood sugars over the past few weeks, with most being in the 110s. He denies hypoglycemia. He and his fiance indicate that they have cut out most carbohydrates in their meals since his diagnosis. He had increased Lantus to 28 units, but had readings in the 80s, so decreased back to 24 units daily. He reports loose stools with metformin, so he discontinued this medication.   He also reports that he is interested in quitting smoking. He currently smokes 6-8 cigarettes daily and also smokes marijuana. He ranks the importance of quitting smoking as a 10 out of 10. He rates his confidence to quit smoking as a 7 out of 10.   Patient reports Diabetes was diagnosed at hospitalization for DKA in 12/2017.   Family/Social History: He and finance have 2 children, are expecting a third in February 2020.  Insurance coverage/medication affordability: No insurance currently, but will have new insurance starting January 2020.   Patient reports adherence with Lantus Current diabetes medications include: Lantus 24 units daily  Patient denies hypoglycemic events.  Patient reported dietary habits: has cut out most carbohydrates since diagnosis. Notes that he does not eat breakfast most days.     O:  Physical Exam  Constitutional: He appears well-developed and well-nourished.    Review of Systems  All other systems reviewed and are negative.    Lab Results  Component Value Date   HGBA1C 12.0 (H) 12/26/2017  C peptide 0.8 ng/mL  12/26/2017 GADA <5.0 (normal) 12/26/2017   Vitals:   01/25/18 1008  BP: 110/68  Pulse: 72  SpO2: 94%    Home fasting CBG: 110s-120s  Clinical ASCVD: No  The ASCVD Risk score Denman George(Goff DC Jr., et al., 2013) failed to calculate for the following reasons:   The 2013 ASCVD risk score is only valid for ages 6940 to 279    A/P: Diabetes newly diagnosed, currently controlled based on BG results. Too soon to check an updated A1c. Appropriate to discontinue metformin due to GI intolerance.  - Continue Lantus 24 units once daily. Provided with samples today in clinic - Discontinue metformin - Discussed potential addition of GLP1/SGLT2 agents moving forward to allow for reduced insulin dosing.  -Extensively discussed pathophysiology of DM, recommended lifestyle interventions, dietary effects on glycemic control -Counseled on s/sx of and management of hypoglycemia -Next A1C anticipated 03/28/2017  Tobacco Cessation: moderate level of dependence with strong potential for success due to understanding of the importance of cessation and family support - Provided information on [place] Quitline. He will call to request nicotine replacement therapy.    Written patient instructions provided.  Total time in face to face counseling 60 minutes.   Follow up Pharmacist or PCP Clinic Visit in 2 months.   Patient seen with Catie Feliz Beamravis, PharmD, PGY2 Pharmacy Resident

## 2018-01-25 NOTE — Patient Instructions (Addendum)
It was great to meet you today!  We are going to make a few changes today:   1) Stop metformin. 2) Continue Lantus 24 units daily.  3) Call the [facility]. They can provide you with free nicotine patches and gum or lozenges, as well as support.    Follow up with us or Dr. Chanetta Marshallimberlake in late January.    Feel free to reach out with any questions or concerns!

## 2018-01-25 NOTE — Assessment & Plan Note (Signed)
Tobacco Cessation: moderate level of dependence with strong potential for success due to understanding of the importance of cessation and family support - Provided information on [place] Quitline. He will call to request nicotine replacement therapy.

## 2018-01-25 NOTE — Assessment & Plan Note (Signed)
Diabetes newly diagnosed, currently controlled based on BG results. Too soon to check an updated A1c. Appropriate to discontinue metformin due to GI intolerance.  - Continue Lantus 24 units once daily. Provided with samples today in clinic - Discontinue metformin - Discussed potential addition of GLP1/SGLT2 agents moving forward to allow for reduced insulin dosing.  -Extensively discussed pathophysiology of DM, recommended lifestyle interventions, dietary effects on glycemic control -Counseled on s/sx of and management of hypoglycemia -Next A1C anticipated 03/28/2017

## 2018-01-25 NOTE — Progress Notes (Addendum)
Patient ID: Richard Gallegos, male   DOB: 06/23/1990, 27 y.o.   MRN: [id number] Reviewed: Agree with Dr. Macky LowerKoval's documentation and management.

## 2018-03-21 ENCOUNTER — Ambulatory Visit: Payer: Self-pay | Admitting: Family Medicine

## 2018-03-26 ENCOUNTER — Other Ambulatory Visit: Payer: Self-pay

## 2018-03-26 ENCOUNTER — Ambulatory Visit (INDEPENDENT_AMBULATORY_CARE_PROVIDER_SITE_OTHER): Payer: Self-pay | Admitting: Family Medicine

## 2018-03-26 ENCOUNTER — Encounter: Payer: Self-pay | Admitting: Family Medicine

## 2018-03-26 VITALS — BP 120/85 | HR 56 | Temp 98.0°F | Ht 72.0 in | Wt 283.0 lb

## 2018-03-26 DIAGNOSIS — Z794 Long term (current) use of insulin: Secondary | ICD-10-CM

## 2018-03-26 DIAGNOSIS — E119 Type 2 diabetes mellitus without complications: Secondary | ICD-10-CM

## 2018-03-26 LAB — POCT GLYCOSYLATED HEMOGLOBIN (HGB A1C): HbA1c, POC (controlled diabetic range): 6.8 % (ref 0.0–7.0)

## 2018-03-26 MED ORDER — METFORMIN HCL 500 MG PO TABS: 500.0000 mg | ORAL_TABLET | Freq: Two times a day (BID) | ORAL | 3 refills | Status: DC

## 2018-03-26 NOTE — Patient Instructions (Signed)
It was a pleasure to see you today! Thank you for choosing [facility] for your primary care. Richard Gallegos was seen for diabetes.   Our plans for today were:  Take half of your Lantus - decrease to 11 Units daily.   Start the metformin 500mg  twice per day. Call me if you have stomach upset or diarrhea.   Keep up the good work with the diet and exercise.   Best,  Dr. Chanetta Marshall

## 2018-03-26 NOTE — Progress Notes (Signed)
   CC: DM  HPI  DM - went down to 22 on Lantus. At one point he got up to 26. Has some lows to 80s fasting in the AM so he decreased. No true hypoglycemia with sxs or CBG <70. He has lost some weight, and tries to eat brown rice and low carbs. Has been more physically active as well.   Smoking both. Has decreased some.   ROS: Denies CP, SOB, abdominal pain, dysuria, changes in BMs.   CC, SH/smoking status, and VS noted  Objective: BP 120/85 (BP Location: Right Arm, Patient Position: Sitting, Cuff Size: Large)   Pulse (!) 56   Temp 98 F (36.7 C) (Oral)   Ht 6' (1.829 m)   Wt 283 lb (128.4 kg)   SpO2 93%   BMI 38.38 kg/m  Gen: NAD, alert, cooperative, and pleasant. HEENT: NCAT, EOMI, PERRL CV: RRR, no murmur Resp: CTAB, no wheezes, non-labored Ext: No edema, warm Neuro: Alert and oriented, Speech clear, No gross deficits  Assessment and plan:  Type 2 diabetes mellitus without complication, with long-term current use of insulin (HCC) Doing exceedingly well. Has plenty of his lantus vial left. Working on Museum/gallery curatorgetting insurance. We will add metformin with hopes to possibly get off insulin completely if continued dietary work and weight loss. Added metformin and counseled patient that it can have GI upset. Explained that metformin would be cheaper than metformin XR since he is self pay for now, but we would be happy to change to XR if he notices issues. Half lantus to 11U and recheck in 1 month with me.    Orders Placed This Encounter  Procedures  . Basic metabolic panel  . HgB A1c    Meds ordered this encounter  Medications  . metFORMIN (GLUCOPHAGE) 500 MG tablet    Sig: Take 1 tablet (500 mg total) by mouth 2 (two) times daily with a meal.    Dispense:  180 tablet    Refill:  3     Loni MuseKate Timberlake, MD, PGY3 03/27/2018 9:45 AM

## 2018-03-27 LAB — BASIC METABOLIC PANEL
BUN / CREAT RATIO: 13 (ref 9–20)
BUN: 12 mg/dL (ref 6–20)
CHLORIDE: 103 mmol/L (ref 96–106)
CO2: 25 mmol/L (ref 20–29)
CREATININE: 0.89 mg/dL (ref 0.76–1.27)
Calcium: 9.2 mg/dL (ref 8.7–10.2)
GFR calc Af Amer: 135 mL/min/{1.73_m2} (ref >59)
GFR calc non Af Amer: 116 mL/min/{1.73_m2} (ref >59)
GLUCOSE: 101 mg/dL — AB (ref 65–99)
Potassium: 4.4 mmol/L (ref 3.5–5.2)
Sodium: 142 mmol/L (ref 134–144)

## 2018-03-27 NOTE — Assessment & Plan Note (Signed)
Doing exceedingly well. Has plenty of his lantus vial left. Working on Museum/gallery curator. We will add metformin with hopes to possibly get off insulin completely if continued dietary work and weight loss. Added metformin and counseled patient that it can have GI upset. Explained that metformin would be cheaper than metformin XR since he is self pay for now, but we would be happy to change to XR if he notices issues. Half lantus to 11U and recheck in 1 month with me.

## 2018-09-12 ENCOUNTER — Encounter: Payer: Self-pay | Admitting: Family Medicine

## 2018-10-02 IMAGING — CR DG CHEST 2V
2 series · 2 of 2 positions shown · non-contrast
Comparison: None.

CLINICAL DATA: 27 y/o M; chills, congestion, body aches, fever,
sore throat.

EXAM:
CHEST  2 VIEW

[chest pa]
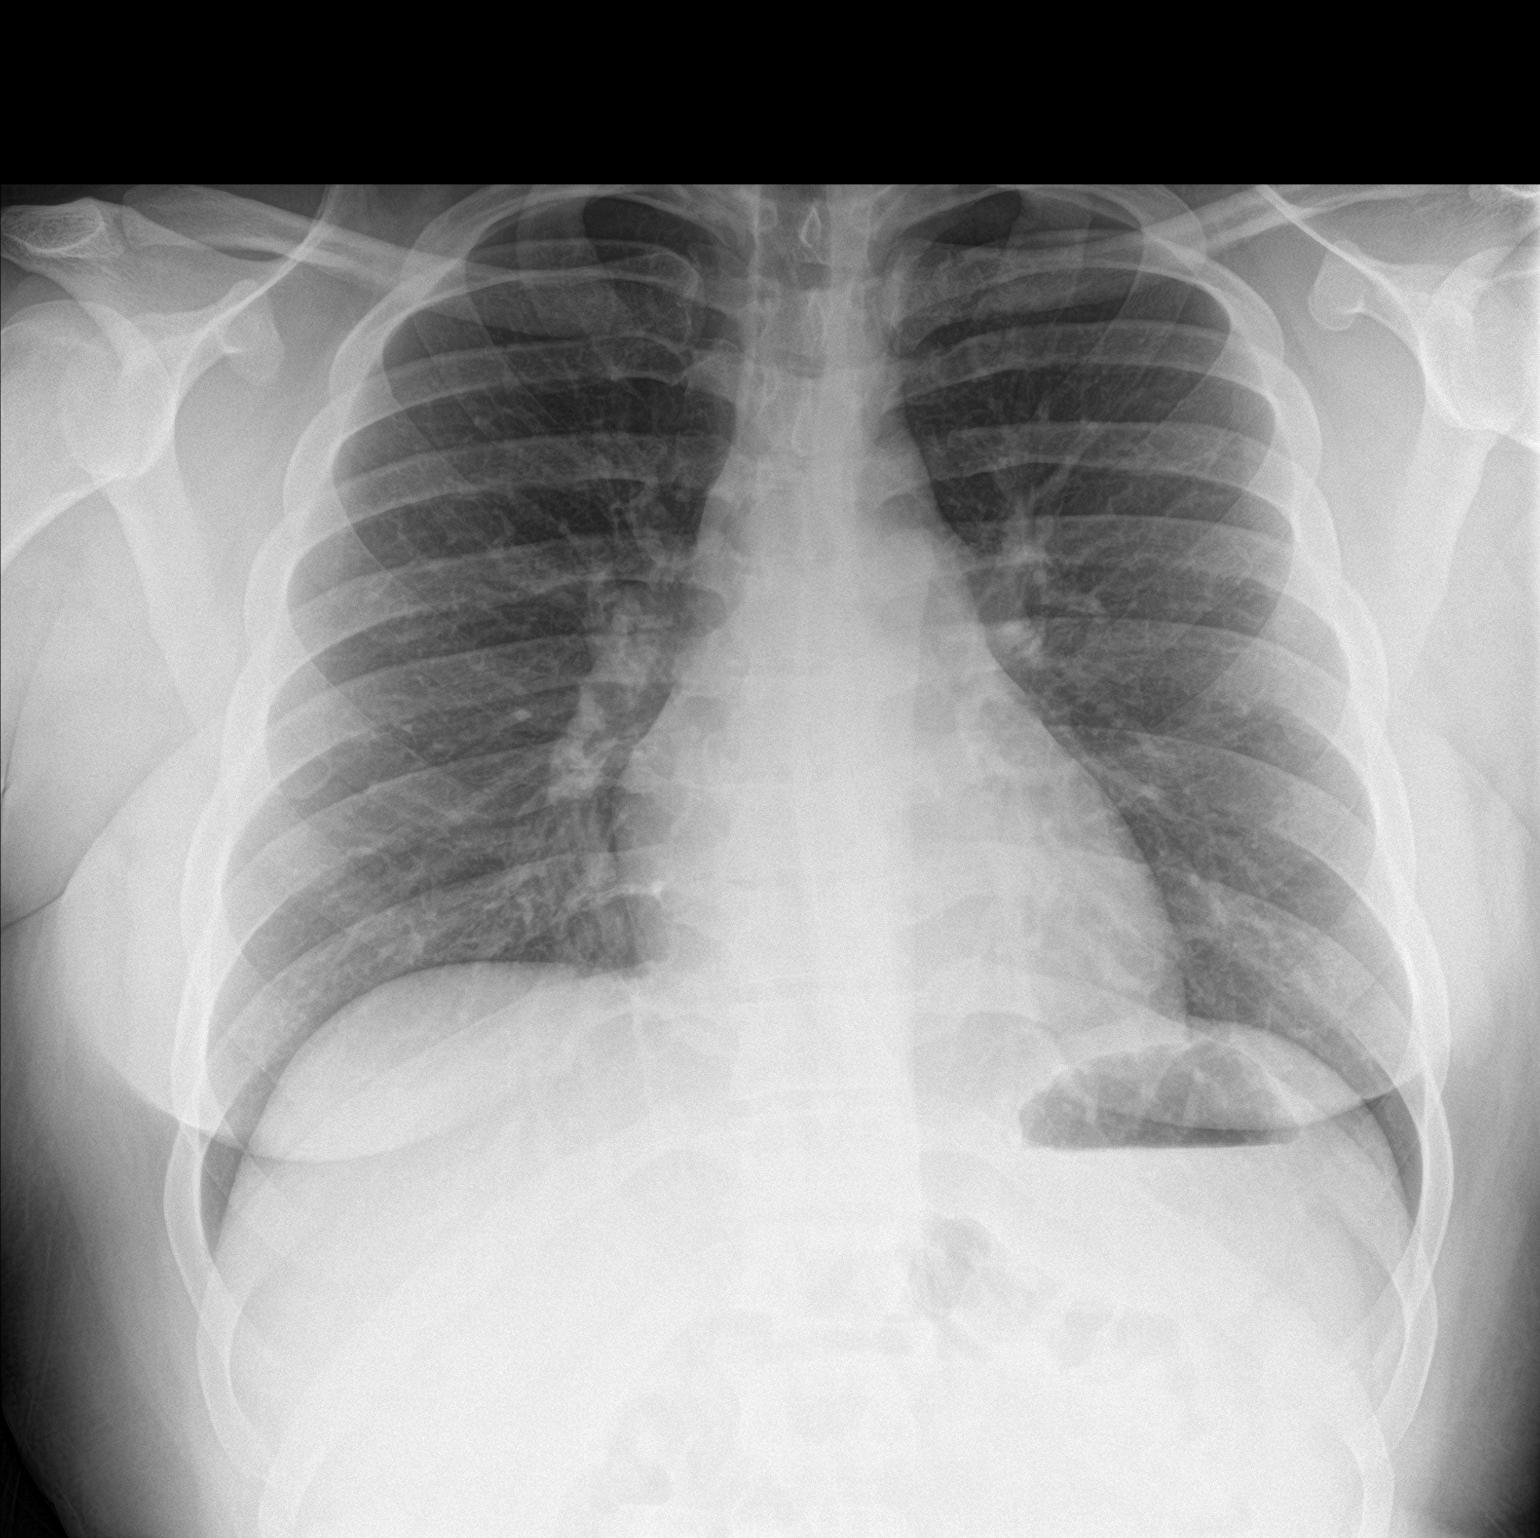

[chest lat]
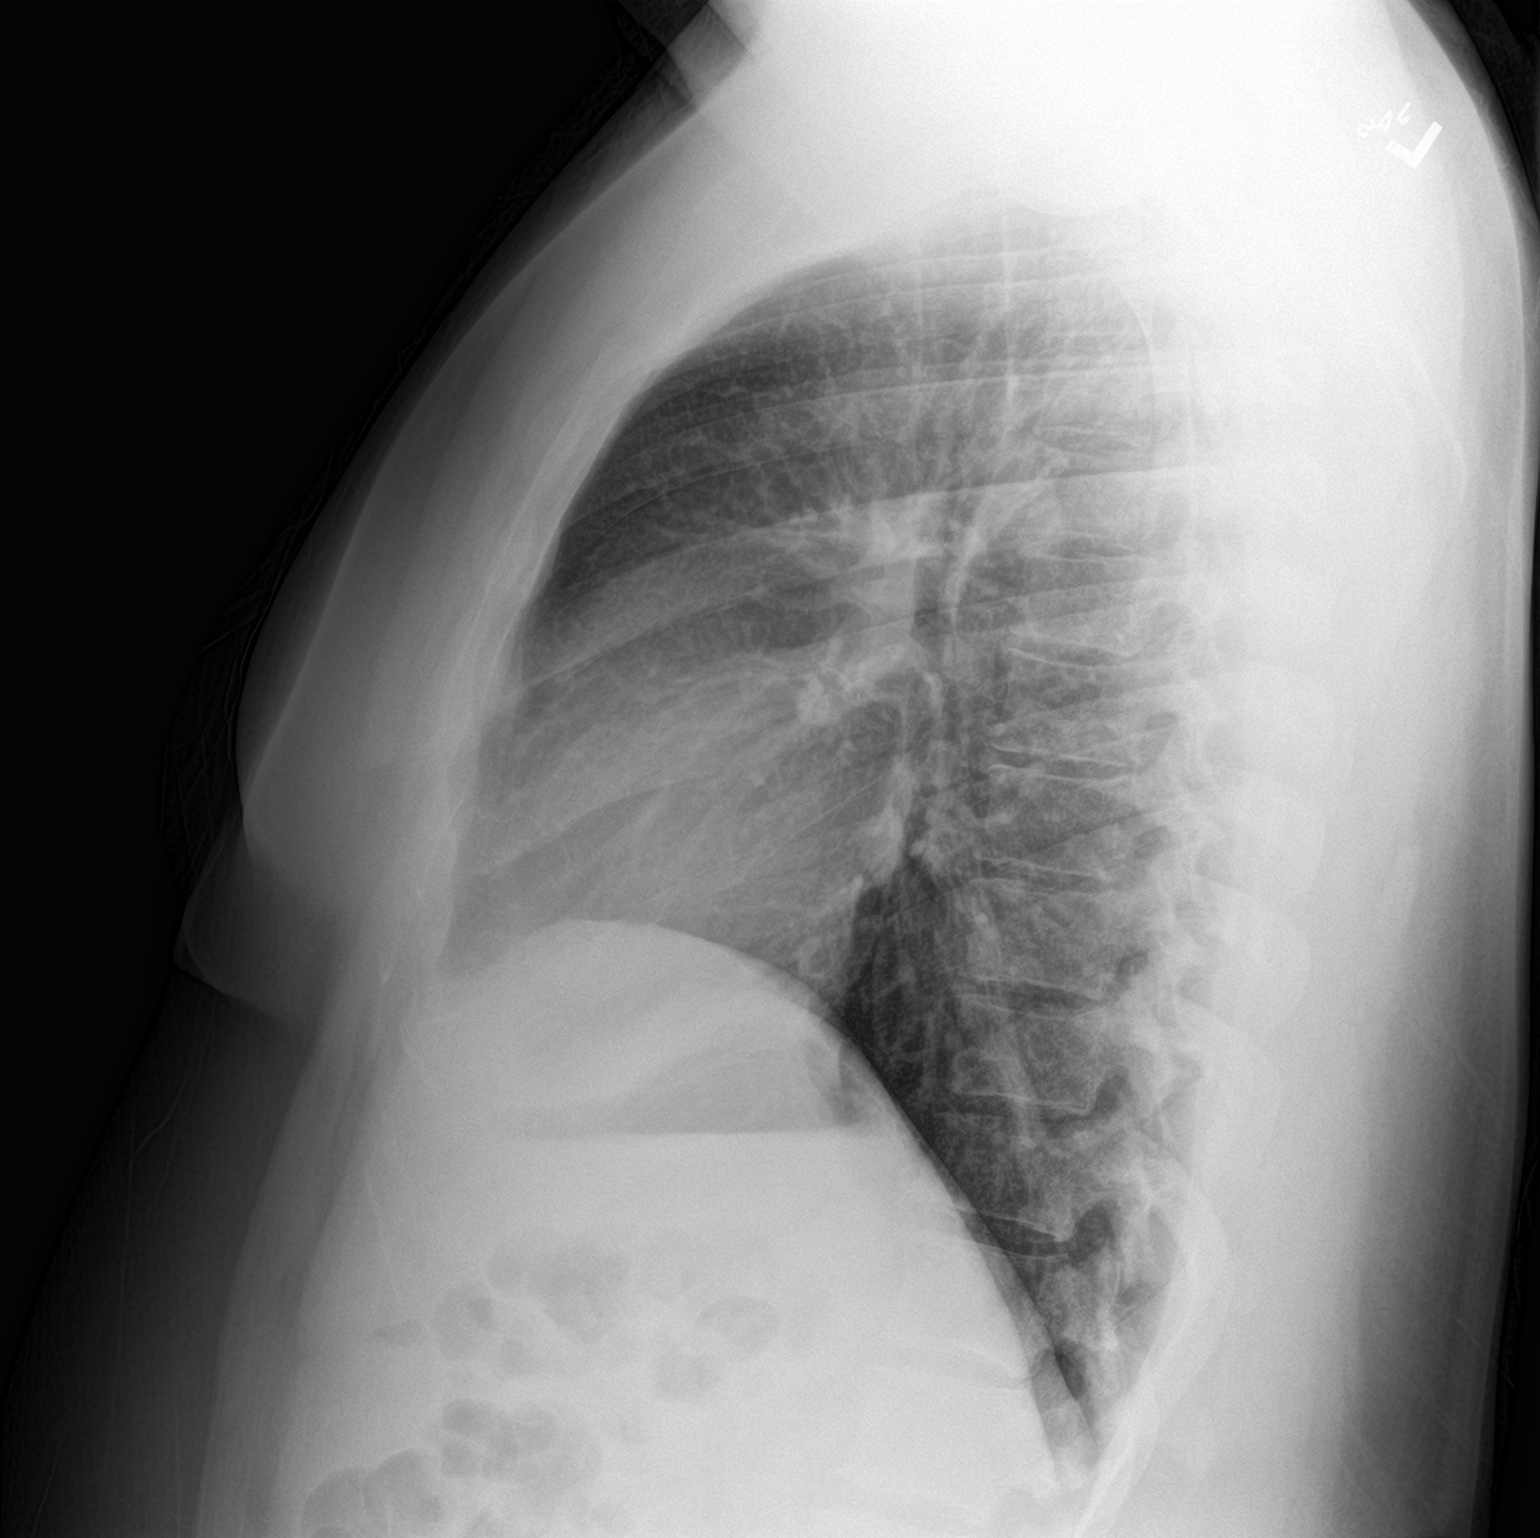

[2 of 2 positions shown; findings below may reference images not displayed]

FINDINGS: The heart size and mediastinal contours are within normal limits.
Both lungs are clear. The visualized skeletal structures are
unremarkable.
IMPRESSION: No active cardiopulmonary disease.

By: Sanford Kohler M.D.

## 2019-01-25 ENCOUNTER — Other Ambulatory Visit: Payer: Self-pay | Admitting: Family Medicine

## 2019-01-25 DIAGNOSIS — E119 Type 2 diabetes mellitus without complications: Secondary | ICD-10-CM

## 2019-01-25 DIAGNOSIS — Z794 Long term (current) use of insulin: Secondary | ICD-10-CM

## 2019-01-27 NOTE — Telephone Encounter (Signed)
Needs appointment

## 2019-10-02 NOTE — Progress Notes (Signed)
    SUBJECTIVE:   CHIEF COMPLAINT / HPI:   Type 2 diabetes Patient last seen on 03/26/2018, A1c at that time 6.8 Current regimen: Metformin 500 mg twice daily, Novolin N 30u QAM has been buying OTC Stopped Lantus due to expense and lack of insurance CBGs hasn't been checking Denies polyuria, polydipsia, hypoglycemic episodes Diet and exercise: not the best with his diet and doesn't exercise much A1c today 12.2 Has insurance now - [employer]   Tobacco Abuse  Smokes 1 pack every 2-3 days Not interested in quitting right now  [employer] Thinks he needs FMLA paperwork signed Patient reports that his diabetes is making him not feel well Feel nausea a lot and has been causing him to miss time for work Has been happening a lot for work   PERTINENT  PMH / PSH: T2DM, Tachycardia  OBJECTIVE:   BP 118/78   Pulse 63   Ht 6' (1.829 m)   Wt 261 lb 6.4 oz (118.6 kg)   SpO2 100%   BMI 35.45 kg/m    Physical Exam:  General: 29 y.o. male in NAD Cardio: RRR no m/r/g Lungs: CTAB, no wheezing, no rhonchi, no crackles, no IWOB on RA Skin: warm and dry, acanthosis nigricans on neck Extremities: No edema   ASSESSMENT/PLAN:   Type 2 diabetes mellitus without complication, with long-term current use of insulin (HCC) Uncontrolled.  A1c 12.2 today.  Discussed with patient that now that he has insurance, recommend discontinuation of Novolin N and will go back to Lantus as patient had good success with this.  We will also increase Metformin 1000 mg twice daily.  Encourage adherence to diabetic diet.  Check CBGs at least 3 times daily.  We will start Lantus at 10 units given increase in Metformin and patient has not recently check sugars, therefore would not like to drop too low.  He has previously increased his Lantus on his own, therefore he would be able to do it again.  We will start with 10 units, increase by 2 units every night if still greater than 200.  Follow-up in 1 month.  Also discussed  with patient that his symptoms will improve with improvement in his diabetic control and do not think that he needs to be off work long-term for this.  Advised if he has any questions or needs any paperwork signed to bring them in.  Will perform BMP, lipid panel, hep C for screening today.  Patient also referred to ophthalmology for diabetic eye exam.  Will need foot exam at next visit.  Tobacco abuse Discussed following up if would like help quitting.     Unknown Jim, DO [facility]

## 2019-10-07 ENCOUNTER — Encounter: Payer: Self-pay | Admitting: Family Medicine

## 2019-10-07 ENCOUNTER — Other Ambulatory Visit: Payer: Self-pay

## 2019-10-07 ENCOUNTER — Ambulatory Visit (INDEPENDENT_AMBULATORY_CARE_PROVIDER_SITE_OTHER): Payer: Self-pay | Admitting: Family Medicine

## 2019-10-07 VITALS — BP 118/78 | HR 63 | Ht 72.0 in | Wt 261.4 lb

## 2019-10-07 DIAGNOSIS — Z1159 Encounter for screening for other viral diseases: Secondary | ICD-10-CM

## 2019-10-07 DIAGNOSIS — Z720 Tobacco use: Secondary | ICD-10-CM

## 2019-10-07 DIAGNOSIS — Z794 Long term (current) use of insulin: Secondary | ICD-10-CM

## 2019-10-07 DIAGNOSIS — E119 Type 2 diabetes mellitus without complications: Secondary | ICD-10-CM

## 2019-10-07 LAB — POCT GLYCOSYLATED HEMOGLOBIN (HGB A1C): HbA1c, POC (controlled diabetic range): 12.2 % — AB (ref 0.0–7.0)

## 2019-10-07 MED ORDER — METFORMIN HCL 1000 MG PO TABS: 1000.0000 mg | ORAL_TABLET | Freq: Two times a day (BID) | ORAL | 3 refills | Status: DC

## 2019-10-07 MED ORDER — LANTUS SOLOSTAR 100 UNIT/ML ~~LOC~~ SOPN: 10.0000 [IU] | PEN_INJECTOR | Freq: Every day | SUBCUTANEOUS | 3 refills | Status: DC

## 2019-10-07 NOTE — Assessment & Plan Note (Addendum)
Uncontrolled.  A1c 12.2 today.  Discussed with patient that now that he has insurance, recommend discontinuation of Novolin N and will go back to Lantus as patient had good success with this.  We will also increase Metformin 1000 mg twice daily.  Encourage adherence to diabetic diet.  Check CBGs at least 3 times daily.  We will start Lantus at 10 units given increase in Metformin and patient has not recently check sugars, therefore would not like to drop too low.  He has previously increased his Lantus on his own, therefore he would be able to do it again.  We will start with 10 units, increase by 2 units every night if still greater than 200.  Follow-up in 1 month.  Also discussed with patient that his symptoms will improve with improvement in his diabetic control and do not think that he needs to be off work long-term for this.  Advised if he has any questions or needs any paperwork signed to bring them in.  Will perform BMP, lipid panel, hep C for screening today.  Patient also referred to ophthalmology for diabetic eye exam.  Will need foot exam at next visit.

## 2019-10-07 NOTE — Assessment & Plan Note (Signed)
Discussed following up if would like help quitting.

## 2019-10-07 NOTE — Patient Instructions (Signed)
Thank you for coming to see me today. It was a pleasure. Today we talked about:   We will increase your metformin to 1000mg  twice a day.  Stop Novolin.  Restart Lantus at 10u daily.  Take in the morning.  If your sugars at night are over 200, increase by 2.  We will get some labs today.  If they are abnormal or we need to do something about them, I will call you.  If they are normal, I will send you a message on MyChart (if it is active) or a letter in the mail.  If you don't hear from in 2 weeks, please call the office at the number below.  I have placed a referral to eye doctor for Diabetic Eye Exam.  If you do not hear from them in the next 2 weeks, please give [place] a call.  Please follow-up with me in 1 month.  If you have any questions or concerns, please do not hesitate to call the office at [phone].  Best,   [phone], DO

## 2019-10-08 LAB — LIPID PANEL
Chol/HDL Ratio: 4.7 ratio (ref 0.0–5.0)
Cholesterol, Total: 236 mg/dL — ABNORMAL HIGH (ref 100–199)
HDL: 50 mg/dL (ref >39)
LDL Chol Calc (NIH): 170 mg/dL — ABNORMAL HIGH (ref 0–99)
Triglycerides: 91 mg/dL (ref 0–149)
VLDL Cholesterol Cal: 16 mg/dL (ref 5–40)

## 2019-10-08 LAB — BASIC METABOLIC PANEL
BUN/Creatinine Ratio: 14 (ref 9–20)
BUN: 11 mg/dL (ref 6–20)
CO2: 23 mmol/L (ref 20–29)
Calcium: 9.2 mg/dL (ref 8.7–10.2)
Chloride: 99 mmol/L (ref 96–106)
Creatinine, Ser: 0.76 mg/dL (ref 0.76–1.27)
GFR calc Af Amer: 142 mL/min/{1.73_m2} (ref >59)
GFR calc non Af Amer: 122 mL/min/{1.73_m2} (ref >59)
Glucose: 309 mg/dL — ABNORMAL HIGH (ref 65–99)
Potassium: 4.5 mmol/L (ref 3.5–5.2)
Sodium: 136 mmol/L (ref 134–144)

## 2019-10-08 LAB — HCV AB W REFLEX TO QUANT PCR: HCV Ab: <0.1 s/co ratio (ref 0.0–0.9)

## 2019-10-08 LAB — HCV INTERPRETATION

## 2019-10-09 ENCOUNTER — Other Ambulatory Visit: Payer: Self-pay | Admitting: Family Medicine

## 2019-10-09 DIAGNOSIS — E1169 Type 2 diabetes mellitus with other specified complication: Secondary | ICD-10-CM

## 2019-10-09 DIAGNOSIS — E785 Hyperlipidemia, unspecified: Secondary | ICD-10-CM

## 2019-10-09 MED ORDER — ATORVASTATIN CALCIUM 40 MG PO TABS: 40.0000 mg | ORAL_TABLET | Freq: Every day | ORAL | 3 refills | Status: DC

## 2019-10-09 NOTE — Progress Notes (Signed)
LDL 170, has DM, will start statin.

## 2020-02-26 ENCOUNTER — Other Ambulatory Visit: Payer: Self-pay

## 2020-02-26 DIAGNOSIS — Z20822 Contact with and (suspected) exposure to covid-19: Secondary | ICD-10-CM

## 2020-02-27 LAB — SARS-COV-2, NAA 2 DAY TAT

## 2020-02-27 LAB — NOVEL CORONAVIRUS, NAA: SARS-CoV-2, NAA: NOT DETECTED

## 2020-08-25 ENCOUNTER — Other Ambulatory Visit: Payer: Self-pay

## 2020-08-25 ENCOUNTER — Ambulatory Visit (INDEPENDENT_AMBULATORY_CARE_PROVIDER_SITE_OTHER): Payer: Self-pay | Admitting: Family Medicine

## 2020-08-25 VITALS — BP 118/78 | HR 66 | Ht 72.0 in | Wt 256.0 lb

## 2020-08-25 DIAGNOSIS — Z794 Long term (current) use of insulin: Secondary | ICD-10-CM

## 2020-08-25 DIAGNOSIS — E1169 Type 2 diabetes mellitus with other specified complication: Secondary | ICD-10-CM

## 2020-08-25 DIAGNOSIS — E785 Hyperlipidemia, unspecified: Secondary | ICD-10-CM

## 2020-08-25 DIAGNOSIS — E119 Type 2 diabetes mellitus without complications: Secondary | ICD-10-CM

## 2020-08-25 LAB — POCT GLYCOSYLATED HEMOGLOBIN (HGB A1C): HbA1c, POC (controlled diabetic range): 12.5 % — AB (ref 0.0–7.0)

## 2020-08-25 MED ORDER — METFORMIN HCL 1000 MG PO TABS
1000.0000 mg | ORAL_TABLET | Freq: Two times a day (BID) | ORAL | 3 refills | Status: AC
  Filled 2020-08-25: qty 60, 30d supply, fill #0

## 2020-08-25 MED ORDER — ATORVASTATIN CALCIUM 40 MG PO TABS
40.0000 mg | ORAL_TABLET | Freq: Every day | ORAL | 3 refills | Status: AC
  Filled 2020-08-25: qty 30, 30d supply, fill #0

## 2020-08-25 MED ORDER — LANTUS SOLOSTAR 100 UNIT/ML ~~LOC~~ SOPN
10.0000 [IU] | PEN_INJECTOR | Freq: Every day | SUBCUTANEOUS | 3 refills | Status: AC
  Filled 2020-08-25: qty 3, 25d supply, fill #0

## 2020-08-25 NOTE — Progress Notes (Signed)
    SUBJECTIVE:   CHIEF COMPLAINT / HPI:   T2DM Meds: Novolin 30 1-2 times a day Not taking metformin, ran out and needs a refill. Has been out for months. Also not taking the Lantus because it is more expensive but is amenable to starting back on it Fasting CBGs low 200s Denies symptomatic hypoglycemia Denies numbness/tingling in feet, has noticed more swelling over the past few days  PERTINENT  PMH / PSH: T2DM, tobacco abuse  OBJECTIVE:   BP 118/78   Pulse 66   Ht 6' (1.829 m)   Wt 256 lb (116.1 kg)   SpO2 98%   BMI 34.72 kg/m   General: obese, NAD CV: RRR, no murmurs Pulm: CTAB, no wheezes or rales Ext: mild non-pitting edema in bilateral feet  Diabetic foot exam was performed.  No deformities or other abnormal visual findings.  Posterior tibialis and dorsalis pulse intact bilaterally.  Intact to touch and monofilament testing bilaterally.    Lab Results  Component Value Date   HGBA1C 12.5 (A) 08/25/2020     ASSESSMENT/PLAN:   Type 2 diabetes mellitus without complication, with long-term current use of insulin (HCC) Poorly controlled. Cost has been an issue with obtaining his medications. Diabetic foot exam updated. - restart metformin 1000 mg BID - restart Lantus at 10 units, increase by 2 units if nighttime reading > 200; sample given today - lifestyle changes discussed - urine microalbumin at follow-up - will send to pharmacy clinic for medication assistance and insulin titration   Medication Samples have been provided to the patient.  Drug name: Lantus       Strength: 100 units/ml        Qty: one 3 ml prefilled pen  LOT: [id number]  Exp.Date: 11/20/20  Dosing instructions: inject 10 units daily. Increase by 2 units if reading at night is over 200.  The patient has been instructed regarding the correct time, dose, and frequency of taking this medication, including desired effects and most common side effects.   Nicholas Ossa 11:57 AM 08/25/2020   HCM -  Covid vaccine declined today, counseling provided and will consider - pneumonia vaccine declined today, counseling provided and will consider - foot exam done today - needs urine micro, collect at follow-up - Tdap declined today, counseling provided and will consider  Littie Deeds, MD [facility]

## 2020-08-25 NOTE — Patient Instructions (Addendum)
It was nice seeing you today!  Start taking the metformin and Lantus again.   Start with 10 units. Increase by 2 units if your nighttime blood sugar is over 200.  I have refilled your prescriptions and sent them to the [facility] pharmacy. You should be able to pick up your Lantus for $10.  Continue working on weight loss, eating healthy, and exercising.  We recommend you get these vaccines: Covid vaccine, pneumonia vaccine, Tdap (tetanus)  Follow-up in 3 months.  Please arrive at least 15 minutes prior to your scheduled appointments.  Stay well, Richard Deeds, MD [facility] [phone]

## 2020-08-25 NOTE — Assessment & Plan Note (Addendum)
Poorly controlled. Cost has been an issue with obtaining his medications. Diabetic foot exam updated. - restart metformin 1000 mg BID - restart Lantus at 10 units, increase by 2 units if nighttime reading > 200; sample given today - lifestyle changes discussed - urine microalbumin at follow-up - will send to pharmacy clinic for medication assistance and insulin titration

## 2020-09-01 ENCOUNTER — Other Ambulatory Visit: Payer: Self-pay

## 2021-07-27 ENCOUNTER — Encounter: Payer: Self-pay | Admitting: *Deleted

## 2021-10-10 ENCOUNTER — Other Ambulatory Visit: Payer: Self-pay

## 2021-10-10 ENCOUNTER — Emergency Department (HOSPITAL_COMMUNITY)
Admission: EM | Admit: 2021-10-10 | Discharge: 2021-10-11 | Disposition: A | Discharge status: Home / Self Care | Payer: Self-pay | Attending: Emergency Medicine | Admitting: Emergency Medicine

## 2021-10-10 ENCOUNTER — Encounter (HOSPITAL_COMMUNITY): Payer: Self-pay | Admitting: Student

## 2021-10-10 DIAGNOSIS — W268XXA Contact with other sharp object(s), not elsewhere classified, initial encounter: Secondary | ICD-10-CM | POA: Insufficient documentation

## 2021-10-10 DIAGNOSIS — S61411A Laceration without foreign body of right hand, initial encounter: Secondary | ICD-10-CM

## 2021-10-10 DIAGNOSIS — Z794 Long term (current) use of insulin: Secondary | ICD-10-CM | POA: Insufficient documentation

## 2021-10-10 DIAGNOSIS — S61212A Laceration without foreign body of right middle finger without damage to nail, initial encounter: Secondary | ICD-10-CM | POA: Insufficient documentation

## 2021-10-10 DIAGNOSIS — Y9389 Activity, other specified: Secondary | ICD-10-CM | POA: Insufficient documentation

## 2021-10-10 MED ORDER — LIDOCAINE-EPINEPHRINE (PF) 2 %-1:200000 IJ SOLN
10.0000 mL | Freq: Once | INTRAMUSCULAR | Status: DC
  Filled 2021-10-10: qty 20

## 2021-10-10 NOTE — ED Provider Triage Note (Signed)
Emergency Medicine Provider Triage Evaluation Note  Brodrick JUNIOUS RAGONE , a 31 y.o. male  was evaluated in triage.  Pt complains of laceration just distal to the R 3rd MCP joint. Sustained while moving a fridge. No crush injury. Full ROM of digit. Tdap UTD, per patient  Review of Systems  Positive: As above Negative: As above  Physical Exam  There were no vitals taken for this visit. Gen:   Awake, no distress   Resp:  Normal effort  MSK:   Moves extremities without difficulty  Other:  3.5cm laceration just distal to the R 3rd MCP joint. Bleeding controlled.  5/5 strength against resistance of FDP, FDS, and extensors of the affected finger.  Capillary refill brisk in all digits of the right hand.  Medical Decision Making  Medically screening exam initiated at 11:44 PM.  Appropriate orders placed.  Alok ESPEN BETHEL was informed that the remainder of the evaluation will be completed by another provider, this initial triage assessment does not replace that evaluation, and the importance of remaining in the ED until their evaluation is complete.  R finger laceration   Antony Madura, PA-C 10/10/21 2347

## 2021-10-10 NOTE — ED Triage Notes (Signed)
Pt here for deep lac to R middle finger. Pt was helping move a refrigerator and cut it on the back ventilation part of the fridge. Pt report tetanus was within the last 5 years, movement & sensation intact.

## 2021-10-11 MED ORDER — LIDOCAINE HCL (PF) 1 % IJ SOLN
10.0000 mL | Freq: Once | INTRAMUSCULAR | Status: AC
  Administered 2021-10-11: 10 mL
  Filled 2021-10-11: qty 10

## 2021-10-11 NOTE — ED Provider Notes (Signed)
Richard Gallegos   CSN: [id number] Arrival date & time: 10/10/21  2219     History  Chief Complaint  Patient presents with   finger laceration    Richard Gallegos is a 31 y.o. male who presents to the ED with complaints of right hand laceration that occurred shortly PTA. Reports he was helping move a refrigerator and cut it on the back ventilation part of the fridge. Area is somewhat painful, no alleviating/aggravating factors. No crush injury. Last tetanus within 5 years. Denies numbness, tingling or weakness. R hand dominant.    HPI     Home Medications Prior to Admission medications   Medication Sig Start Date End Date Taking? Authorizing Provider  atorvastatin (LIPITOR) 40 MG tablet Take 1 tablet (40 mg total) by mouth daily. 08/25/20   Zola Button, MD  glucose blood (RELION PRIME TEST) test strip Use as instructed 12/27/17   Glenis Smoker, MD  insulin glargine (LANTUS SOLOSTAR) 100 UNIT/ML Solostar Pen Inject 10 Units into the skin daily. Increase by 2 units if reading at night is over 200. 08/25/20   Zola Button, MD  metFORMIN (GLUCOPHAGE) 1000 MG tablet Take 1 tablet (1,000 mg total) by mouth 2 (two) times daily with a meal. 08/25/20   Zola Button, MD  nicotine (NICODERM CQ - DOSED IN MG/24 HOURS) 14 mg/24hr patch Place 1 patch (14 mg total) onto the skin daily. Patient not taking: No sig reported 12/28/17   Glenis Smoker, MD  RELION ULTRA THIN LANCETS 30G MISC 1 Device by Does not apply route as directed. 12/27/17   Glenis Smoker, MD      Allergies    Patient has no known allergies.    Review of Systems   Review of Systems  Constitutional:  Negative for chills and fever.  Skin:  Positive for wound.  Neurological:  Negative for weakness and numbness.  All other systems reviewed and are negative.   Physical Exam Updated Vital Signs BP 128/87 (BP Location: Right Arm)   Pulse 73   Temp 98.6 F (37  C) (Oral)   Resp 18   SpO2 100%  Physical Exam Vitals and nursing Gallegos reviewed.  Constitutional:      General: He is not in acute distress.    Appearance: Normal appearance. He is not ill-appearing or toxic-appearing.  HENT:     Head: Normocephalic and atraumatic.  Neck:     Comments: No midline tenderness.  Cardiovascular:     Rate and Rhythm: Normal rate.     Pulses:          Radial pulses are 2+ on the right side and 2+ on the left side.  Pulmonary:     Effort: No respiratory distress.     Breath sounds: Normal breath sounds.  Musculoskeletal:     Cervical back: Normal range of motion and neck supple.     Comments: Upper extremities: 4 cm irregular shape laceration to the dorsal aspect of the right 3rd MCP extending to ulnar aspect of the proximal phalanx. 4-5 mm deep, extensor tendon visualized- no evidence of injury to the tendon or visible FB. No active bleeding. Patient has intact AROM throughout. Able to flex/extend MCP/IP joints of the right 3rd digit against resistance. Able to perform OK sign, thumbs up, and cross 2nd/3rd digits. Tender to palpation over the wound, otherwise nontender.    Skin:    General: Skin is warm and dry.  Capillary Refill: Capillary refill takes less than 2 seconds.  Neurological:     Mental Status: He is alert.     Comments: Alert. Clear speech. Sensation grossly intact to bilateral upper extremities. 5/5 symmetric grip strength. Ambulatory.   Psychiatric:        Mood and Affect: Mood normal.        Behavior: Behavior normal.     ED Results / Procedures / Treatments   Labs (all labs ordered are listed, but only abnormal results are displayed) Labs Reviewed - No data to display  EKG None  Radiology No results found.  Procedures .Marland KitchenLaceration Repair  Date/Time: 10/11/2021 12:28 AM  Performed by: Cherly Anderson, PA-C Authorized by: Cherly Anderson, PA-C   Consent:    Consent obtained:  Verbal   Consent given by:   Patient   Risks, benefits, and alternatives were discussed: yes     Risks discussed:  Infection, need for additional repair, nerve damage, poor wound healing, poor cosmetic result, pain, retained foreign body, tendon damage and vascular damage   Alternatives discussed:  No treatment Anesthesia:    Anesthesia method:  Local infiltration   Local anesthetic:  Lidocaine 1% w/o epi Laceration details:    Location:  Finger   Finger location: dorsal 3rd MCP of the right hand.   Length (cm):  4   Depth (mm):  5 Pre-procedure details:    Preparation:  Patient was prepped and draped in usual sterile fashion Exploration:    Hemostasis achieved with:  Direct pressure   Contaminated: no   Treatment:    Area cleansed with:  Povidone-iodine   Amount of cleaning:  Standard   Irrigation solution:  Sterile water   Irrigation method:  Pressure wash Skin repair:    Repair method:  Sutures   Suture size:  4-0   Suture material:  Nylon   Suture technique:  Simple interrupted   Number of sutures:  5 Approximation:    Approximation:  Close Post-procedure details:    Dressing:  Antibiotic ointment, bulky dressing and non-adherent dressing   Procedure completion:  Tolerated well, no immediate complications     Medications Ordered in ED Medications  lidocaine (PF) (XYLOCAINE) 1 % injection 10 mL (has no administration in time range)    ED Course/ Medical Decision Making/ A&P                           Medical Decision Making Risk Prescription drug management.   Patient presents to the ED with right dorsal 3rd MCP laceration.  Nontoxic, vitals WNL.  Cleansed w/ betadine, pressure irrigated & visualized in bloodless field without evidence of FB. Marland Kitchen  Normal ROM, minimally tender and only over wound, and with mechanism low suspicion for underlying fx. Repaired per procedure Gallegos above. Tolerated well. NVI distally prior to and following procedure, able to flex/extend MCP/IP against resistance  prior to and following procedure- do not suspect significant tendon injury. Tetanus up to date. Appears appropriate for discharge. Discussed wound care and need for follow up with suture removal and strict return precautions.   I discussed  treatment plan, need for follow-up, and return precautions with the patient. Provided opportunity for questions, patient confirmed understanding and is in agreement with plan.   Final Clinical Impression(s) / ED Diagnoses Final diagnoses:  Laceration of right hand without foreign body, initial encounter    Rx / DC Orders ED Discharge Orders  None         Cherly Anderson, PA-C 10/11/21 0055    Zadie Rhine, MD 10/11/21 [phone]

## 2021-10-11 NOTE — Discharge Instructions (Signed)
You were seen in the emergency department today for a laceration. Your laceration was closed with 5 stitches. Please keep this area clean and dry for the next 24 hours, after 24 hours you may get this area wet, but avoid soaking the area. Keep the area covered as best possible especially when in the sun to help in minimizing scarring.   You will need to have the stitches removed and the wound rechecked in 8-10 days. Please return to the emergency department, go to an urgent care, or see your primary care provider to have this performed. Return to the ER soon should you start to experience pus type drainage from the wound, redness around the wound, or fevers as this could indicate the area is infected, please return to the ER for any other worsening symptoms or concerns that you may have.

## 2021-10-23 ENCOUNTER — Ambulatory Visit
Admission: EM | Admit: 2021-10-23 | Discharge: 2021-10-23 | Disposition: A | Discharge status: Home / Self Care | Payer: Self-pay

## 2021-10-23 DIAGNOSIS — S61411D Laceration without foreign body of right hand, subsequent encounter: Secondary | ICD-10-CM

## 2021-10-23 DIAGNOSIS — Z4802 Encounter for removal of sutures: Secondary | ICD-10-CM

## 2021-10-27 NOTE — ED Notes (Signed)
Sutures were removed and incision WNL. Bandage placed and patient was discharged.

## 2021-10-27 NOTE — ED Triage Notes (Signed)
Pt is here to have sutures removed. 

## 2021-11-12 ENCOUNTER — Encounter (HOSPITAL_COMMUNITY): Payer: Self-pay | Admitting: Student
